# Patient Record
Sex: Male | Born: 1935 | Race: White | Hispanic: No | Marital: Married | Smoking: Former smoker
Health system: Southern US, Community
[De-identification: ages and names within clinical notes are randomized; demographics above are authoritative.]

## PROBLEM LIST (undated history)

## (undated) DIAGNOSIS — I2510 Atherosclerotic heart disease of native coronary artery without angina pectoris: Secondary | ICD-10-CM

## (undated) DIAGNOSIS — E119 Type 2 diabetes mellitus without complications: Secondary | ICD-10-CM

## (undated) DIAGNOSIS — I6529 Occlusion and stenosis of unspecified carotid artery: Secondary | ICD-10-CM

## (undated) DIAGNOSIS — C61 Malignant neoplasm of prostate: Secondary | ICD-10-CM

## (undated) DIAGNOSIS — R51 Headache: Secondary | ICD-10-CM

## (undated) DIAGNOSIS — I714 Abdominal aortic aneurysm, without rupture, unspecified: Secondary | ICD-10-CM

## (undated) DIAGNOSIS — I10 Essential (primary) hypertension: Secondary | ICD-10-CM

## (undated) DIAGNOSIS — C4490 Unspecified malignant neoplasm of skin, unspecified: Secondary | ICD-10-CM

## (undated) DIAGNOSIS — N200 Calculus of kidney: Secondary | ICD-10-CM

## (undated) DIAGNOSIS — J40 Bronchitis, not specified as acute or chronic: Secondary | ICD-10-CM

## (undated) DIAGNOSIS — M1990 Unspecified osteoarthritis, unspecified site: Secondary | ICD-10-CM

## (undated) DIAGNOSIS — N189 Chronic kidney disease, unspecified: Secondary | ICD-10-CM

## (undated) DIAGNOSIS — I208 Other forms of angina pectoris: Secondary | ICD-10-CM

## (undated) DIAGNOSIS — I2089 Other forms of angina pectoris: Secondary | ICD-10-CM

## (undated) DIAGNOSIS — E7800 Pure hypercholesterolemia, unspecified: Secondary | ICD-10-CM

## (undated) DIAGNOSIS — K5190 Ulcerative colitis, unspecified, without complications: Secondary | ICD-10-CM

## (undated) HISTORY — DX: Other forms of angina pectoris: I20.89

## (undated) HISTORY — DX: Malignant neoplasm of prostate: C61

## (undated) HISTORY — DX: Essential (primary) hypertension: I10

## (undated) HISTORY — DX: Calculus of kidney: N20.0

## (undated) HISTORY — PX: LITHOTRIPSY: SUR834

## (undated) HISTORY — PX: CORONARY ARTERY BYPASS GRAFT: SHX141

## (undated) HISTORY — DX: Other forms of angina pectoris: I20.8

## (undated) HISTORY — PX: APPENDECTOMY: SHX54

## (undated) HISTORY — DX: Occlusion and stenosis of unspecified carotid artery: I65.29

## (undated) HISTORY — PX: SKIN CANCER EXCISION: SHX779

## (undated) HISTORY — DX: Atherosclerotic heart disease of native coronary artery without angina pectoris: I25.10

## (undated) HISTORY — DX: Abdominal aortic aneurysm, without rupture: I71.4

## (undated) HISTORY — DX: Type 2 diabetes mellitus without complications: E11.9

## (undated) HISTORY — DX: Chronic kidney disease, unspecified: N18.9

## (undated) HISTORY — PX: CORONARY ANGIOPLASTY WITH STENT PLACEMENT: SHX49

## (undated) HISTORY — DX: Abdominal aortic aneurysm, without rupture, unspecified: I71.40

## (undated) HISTORY — PX: TRANSURETHRAL RESECTION OF BLADDER TUMOR: SHX2575

## (undated) HISTORY — PX: CARDIAC CATHETERIZATION: SHX172

## (undated) HISTORY — DX: Pure hypercholesterolemia, unspecified: E78.00

---

## 1979-04-09 HISTORY — PX: OTHER SURGICAL HISTORY: SHX169

## 1994-04-08 DIAGNOSIS — C61 Malignant neoplasm of prostate: Secondary | ICD-10-CM

## 1994-04-08 HISTORY — PX: RADIOACTIVE SEED IMPLANT: SHX5150

## 1994-04-08 HISTORY — PX: ABDOMINOPERINEAL PROCTOCOLECTOMY: SUR8

## 1994-04-08 HISTORY — DX: Malignant neoplasm of prostate: C61

## 1997-09-10 ENCOUNTER — Inpatient Hospital Stay (HOSPITAL_COMMUNITY): Admission: EM | Admit: 1997-09-10 | Discharge: 1997-09-11 | Payer: Self-pay | Admitting: Emergency Medicine

## 2000-06-26 ENCOUNTER — Encounter: Payer: Self-pay | Admitting: Cardiology

## 2000-06-26 ENCOUNTER — Ambulatory Visit (HOSPITAL_COMMUNITY): Admission: RE | Admit: 2000-06-26 | Discharge: 2000-06-26 | Payer: Self-pay | Admitting: Cardiology

## 2004-01-23 ENCOUNTER — Inpatient Hospital Stay (HOSPITAL_COMMUNITY): Admission: RE | Admit: 2004-01-23 | Discharge: 2004-01-24 | Payer: Self-pay | Admitting: Cardiology

## 2004-08-02 ENCOUNTER — Ambulatory Visit: Payer: Self-pay | Admitting: Cardiology

## 2004-10-29 ENCOUNTER — Ambulatory Visit: Payer: Self-pay | Admitting: Cardiology

## 2005-02-07 ENCOUNTER — Ambulatory Visit: Payer: Self-pay | Admitting: Cardiology

## 2005-09-17 ENCOUNTER — Ambulatory Visit: Payer: Self-pay | Admitting: Cardiology

## 2005-10-03 ENCOUNTER — Ambulatory Visit: Payer: Self-pay

## 2005-10-29 ENCOUNTER — Ambulatory Visit: Payer: Self-pay | Admitting: Cardiology

## 2006-01-17 ENCOUNTER — Ambulatory Visit: Payer: Self-pay | Admitting: Cardiology

## 2006-04-25 ENCOUNTER — Ambulatory Visit: Payer: Self-pay

## 2006-05-01 ENCOUNTER — Ambulatory Visit: Payer: Self-pay | Admitting: Cardiology

## 2006-06-05 ENCOUNTER — Ambulatory Visit: Payer: Self-pay | Admitting: *Deleted

## 2006-10-23 ENCOUNTER — Ambulatory Visit: Payer: Self-pay | Admitting: Cardiology

## 2006-11-24 ENCOUNTER — Ambulatory Visit: Payer: Self-pay | Admitting: Cardiology

## 2006-11-24 ENCOUNTER — Ambulatory Visit: Payer: Self-pay

## 2007-02-12 ENCOUNTER — Ambulatory Visit: Payer: Self-pay | Admitting: Cardiology

## 2007-05-14 ENCOUNTER — Ambulatory Visit: Payer: Self-pay

## 2007-06-15 ENCOUNTER — Ambulatory Visit: Payer: Self-pay | Admitting: Cardiology

## 2007-10-15 ENCOUNTER — Ambulatory Visit: Payer: Self-pay | Admitting: Cardiology

## 2008-01-11 ENCOUNTER — Ambulatory Visit: Payer: Self-pay

## 2008-01-11 ENCOUNTER — Ambulatory Visit: Payer: Self-pay | Admitting: Cardiology

## 2008-06-01 ENCOUNTER — Ambulatory Visit: Payer: Self-pay

## 2008-06-14 ENCOUNTER — Ambulatory Visit: Payer: Self-pay | Admitting: Cardiology

## 2008-07-08 DIAGNOSIS — I714 Abdominal aortic aneurysm, without rupture, unspecified: Secondary | ICD-10-CM | POA: Insufficient documentation

## 2008-07-08 DIAGNOSIS — Z8546 Personal history of malignant neoplasm of prostate: Secondary | ICD-10-CM

## 2008-07-08 DIAGNOSIS — I10 Essential (primary) hypertension: Secondary | ICD-10-CM | POA: Insufficient documentation

## 2008-07-08 DIAGNOSIS — I25810 Atherosclerosis of coronary artery bypass graft(s) without angina pectoris: Secondary | ICD-10-CM

## 2008-07-08 DIAGNOSIS — Z87442 Personal history of urinary calculi: Secondary | ICD-10-CM

## 2008-07-08 DIAGNOSIS — I6529 Occlusion and stenosis of unspecified carotid artery: Secondary | ICD-10-CM

## 2008-07-08 DIAGNOSIS — E7800 Pure hypercholesterolemia, unspecified: Secondary | ICD-10-CM

## 2008-07-08 DIAGNOSIS — N259 Disorder resulting from impaired renal tubular function, unspecified: Secondary | ICD-10-CM | POA: Insufficient documentation

## 2008-07-13 ENCOUNTER — Encounter: Payer: Self-pay | Admitting: Cardiology

## 2008-07-13 ENCOUNTER — Ambulatory Visit: Payer: Self-pay | Admitting: Cardiology

## 2008-07-14 LAB — CONVERTED CEMR LAB
BUN: 34 mg/dL — ABNORMAL HIGH (ref 6–23)
Basophils Absolute: 0.1 10*3/uL (ref 0.0–0.1)
Basophils Relative: 1.3 % (ref 0.0–3.0)
CO2: 28 meq/L (ref 19–32)
Calcium: 9 mg/dL (ref 8.4–10.5)
Chloride: 104 meq/L (ref 96–112)
Creatinine, Ser: 2.3 mg/dL — ABNORMAL HIGH (ref 0.4–1.5)
Eosinophils Absolute: 0.2 10*3/uL (ref 0.0–0.7)
Eosinophils Relative: 2.9 % (ref 0.0–5.0)
Free T4: 0.8 ng/dL (ref 0.6–1.6)
GFR calc Af Amer: 36 mL/min
GFR calc non Af Amer: 29 mL/min
Glucose, Bld: 108 mg/dL — ABNORMAL HIGH (ref 70–99)
HCT: 34 % — ABNORMAL LOW (ref 39.0–52.0)
Hemoglobin: 11.8 g/dL — ABNORMAL LOW (ref 13.0–17.0)
Lymphocytes Relative: 30.8 % (ref 12.0–46.0)
MCHC: 34.8 g/dL (ref 30.0–36.0)
MCV: 96.2 fL (ref 78.0–100.0)
Monocytes Absolute: 0.6 10*3/uL (ref 0.1–1.0)
Monocytes Relative: 7.9 % (ref 3.0–12.0)
Neutro Abs: 4.4 10*3/uL (ref 1.4–7.7)
Neutrophils Relative %: 57.1 % (ref 43.0–77.0)
Platelets: 202 10*3/uL (ref 150–400)
Potassium: 4.6 meq/L (ref 3.5–5.1)
RBC: 3.53 M/uL — ABNORMAL LOW (ref 4.22–5.81)
RDW: 12.1 % (ref 11.5–14.6)
Sodium: 138 meq/L (ref 135–145)
T3, Free: 3.5 pg/mL (ref 2.3–4.2)
TSH: 1.62 microintl units/mL (ref 0.35–5.50)
WBC: 7.6 10*3/uL (ref 4.5–10.5)

## 2008-10-05 ENCOUNTER — Encounter: Payer: Self-pay | Admitting: Cardiology

## 2008-10-07 ENCOUNTER — Encounter: Payer: Self-pay | Admitting: Cardiology

## 2008-10-13 ENCOUNTER — Ambulatory Visit: Payer: Self-pay | Admitting: Cardiology

## 2008-11-22 ENCOUNTER — Encounter: Payer: Self-pay | Admitting: Cardiology

## 2009-01-04 ENCOUNTER — Ambulatory Visit: Payer: Self-pay | Admitting: Cardiology

## 2009-01-04 ENCOUNTER — Ambulatory Visit: Payer: Self-pay

## 2009-01-09 ENCOUNTER — Telehealth: Payer: Self-pay | Admitting: Cardiology

## 2009-01-16 ENCOUNTER — Telehealth: Payer: Self-pay | Admitting: Cardiology

## 2009-04-05 ENCOUNTER — Ambulatory Visit: Payer: Self-pay | Admitting: Cardiology

## 2009-04-05 DIAGNOSIS — I059 Rheumatic mitral valve disease, unspecified: Secondary | ICD-10-CM | POA: Insufficient documentation

## 2009-04-25 ENCOUNTER — Encounter: Payer: Self-pay | Admitting: Cardiology

## 2009-04-25 ENCOUNTER — Ambulatory Visit (HOSPITAL_COMMUNITY): Admission: RE | Admit: 2009-04-25 | Discharge: 2009-04-25 | Payer: Self-pay | Admitting: Cardiology

## 2009-04-25 ENCOUNTER — Ambulatory Visit: Payer: Self-pay | Admitting: Cardiology

## 2009-04-25 ENCOUNTER — Ambulatory Visit: Payer: Self-pay

## 2009-04-26 ENCOUNTER — Encounter: Payer: Self-pay | Admitting: Cardiology

## 2009-04-28 ENCOUNTER — Telehealth: Payer: Self-pay | Admitting: Cardiology

## 2009-05-10 ENCOUNTER — Encounter: Payer: Self-pay | Admitting: Cardiology

## 2009-05-10 ENCOUNTER — Ambulatory Visit: Payer: Self-pay | Admitting: Vascular Surgery

## 2009-06-30 ENCOUNTER — Encounter: Payer: Self-pay | Admitting: Cardiology

## 2009-07-03 ENCOUNTER — Ambulatory Visit: Payer: Self-pay

## 2009-07-03 ENCOUNTER — Ambulatory Visit: Payer: Self-pay | Admitting: Cardiology

## 2009-07-04 ENCOUNTER — Ambulatory Visit: Payer: Self-pay | Admitting: Cardiology

## 2009-07-04 ENCOUNTER — Inpatient Hospital Stay (HOSPITAL_COMMUNITY): Admission: RE | Admit: 2009-07-04 | Discharge: 2009-07-09 | Payer: Self-pay | Admitting: Cardiology

## 2009-07-04 ENCOUNTER — Telehealth: Payer: Self-pay | Admitting: Cardiology

## 2009-07-07 LAB — CONVERTED CEMR LAB
BUN: 27 mg/dL — ABNORMAL HIGH (ref 6–23)
CO2: 24 meq/L (ref 19–32)
Calcium: 9.4 mg/dL (ref 8.4–10.5)
Chloride: 104 meq/L (ref 96–112)
Creatinine, Ser: 2.1 mg/dL — ABNORMAL HIGH (ref 0.4–1.5)
GFR calc non Af Amer: 32.5 mL/min (ref >60)
Glucose, Bld: 106 mg/dL — ABNORMAL HIGH (ref 70–99)
Potassium: 4.6 meq/L (ref 3.5–5.1)
Sodium: 137 meq/L (ref 135–145)

## 2009-07-10 ENCOUNTER — Encounter: Payer: Self-pay | Admitting: Cardiology

## 2009-07-20 ENCOUNTER — Encounter: Payer: Self-pay | Admitting: Cardiology

## 2009-07-21 ENCOUNTER — Ambulatory Visit: Payer: Self-pay | Admitting: Cardiology

## 2009-07-31 ENCOUNTER — Encounter: Payer: Self-pay | Admitting: Cardiology

## 2009-07-31 LAB — CONVERTED CEMR LAB
BUN: 29 mg/dL — ABNORMAL HIGH (ref 6–23)
CO2: 21 meq/L (ref 19–32)
Calcium: 9.2 mg/dL (ref 8.4–10.5)
Chloride: 100 meq/L (ref 96–112)
Creatinine, Ser: 2.24 mg/dL — ABNORMAL HIGH (ref 0.40–1.50)
Glucose, Bld: 108 mg/dL — ABNORMAL HIGH (ref 70–99)
Potassium: 5 meq/L (ref 3.5–5.3)
Sodium: 135 meq/L (ref 135–145)

## 2009-10-02 ENCOUNTER — Ambulatory Visit: Payer: Self-pay | Admitting: Cardiology

## 2009-12-14 ENCOUNTER — Ambulatory Visit: Payer: Self-pay | Admitting: Vascular Surgery

## 2010-01-22 ENCOUNTER — Ambulatory Visit: Payer: Self-pay | Admitting: Cardiology

## 2010-01-22 ENCOUNTER — Encounter: Payer: Self-pay | Admitting: Cardiology

## 2010-01-23 ENCOUNTER — Telehealth: Payer: Self-pay | Admitting: Cardiology

## 2010-01-23 LAB — CONVERTED CEMR LAB
BUN: 39 mg/dL — ABNORMAL HIGH (ref 6–23)
CO2: 25 meq/L (ref 19–32)
Calcium: 9.2 mg/dL (ref 8.4–10.5)
Chloride: 105 meq/L (ref 96–112)
Creatinine, Ser: 2.6 mg/dL — ABNORMAL HIGH (ref 0.4–1.5)
GFR calc non Af Amer: 25.25 mL/min (ref >60)
Glucose, Bld: 124 mg/dL — ABNORMAL HIGH (ref 70–99)
Potassium: 4.4 meq/L (ref 3.5–5.1)
Sodium: 138 meq/L (ref 135–145)

## 2010-01-29 ENCOUNTER — Encounter: Payer: Self-pay | Admitting: Cardiology

## 2010-05-10 NOTE — Progress Notes (Signed)
Summary: admit to [facility] today cath in am  Phone Note Call from Patient Call back at Home Phone [phone]   Caller: Patient Reason for Call: Talk to Nurse Summary of Call: per pt calling, aware that lauren is not in office today, pt was told by ts that he suppose to be admit to [facility] for cath in am. was told to call office if he hasn't heard anything from [facility] by 3 p.m. advise pt i would send an important message to the message nurse.  Initial call taken by: Lorne Skeens,  July 04, 2009 3:07 PM  Follow-up for Phone Call        per pt placement - go to admitting.  Pt aware. Follow-up by: Charolotte Capuchin, RN,  July 04, 2009 3:28 PM

## 2010-05-10 NOTE — Assessment & Plan Note (Signed)
Summary: eph   Visit Type:  Post-hospital  CC:  Dizziness couple days ago.  History of Present Illness: Sherrie Mustache, Valinda Hoar [phone] would like results of BMET.  Had 17 cc of  contrast for cath.  Findings included patent LIMA to LAD, SVG to first OM, occluded second limb, and occluded RCA graft.  The LAD has prox disease and provides septal to PDA.   Has not done much since his discharge from the hospital. We reviewed options, reviewed films, and talked management strategy today.  No major issues at present.  He does note the difference.  Medication was added.     Current Medications (verified): 1)  Omeprazole 20 Mg Tbec (Omeprazole) .... Take 1 Tablet By Mouth Once A Day 2)  Amlodipine Besylate 10 Mg Tabs (Amlodipine Besylate) .... Take One Tablet By Mouth Daily 3)  Calcitriol 0.25 Mcg Caps (Calcitriol) .... Take One Cap. M,w,f and S 4)  Pravastatin Sodium 40 Mg Tabs (Pravastatin Sodium) .... Take One Tablet By Mouth Daily At Bedtime 5)  Potassium Citrate 1080 Mg Cr-Tabs (Potassium Citrate) .... Take 1 Tablet By Mouth Two Times A Day 6)  Folic Acid 800 Mcg Tabs (Folic Acid) .... Take 1 Tablet By Mouth Once A Day 7)  Aspirin 81 Mg Tbec (Aspirin) .... Take One Tablet By Mouth Daily 8)  Carvedilol 6.25 Mg Tabs (Carvedilol) .... Take 1 1/2 Tablets Two Times A Day 9)  Sleep Aid 25 Mg Tabs (Doxylamine Succinate (Sleep)) .... At Bedtime 10)  Isosorbide Mononitrate Cr 30 Mg Xr24h-Tab (Isosorbide Mononitrate) .... Take One Tablet By Mouth Daily 11)  Desonide 0.05 % Crea (Desonide) .... As Needed 12)  Ketoconazole 2 % Sham (Ketoconazole) .... As Directed 13)  Diphenhydramine Hcl 25 Mg Caps (Diphenhydramine Hcl) .... As Needed  Allergies: 1)  ! Darvon 2)  ! Codeine  Vital Signs:  Patient profile:   77 year old male Height:      70 inches Weight:      186 pounds BMI:     26.78 Pulse rate:   72 / minute Pulse rhythm:   regular Resp:     18 per minute BP sitting:   130 / 60  (left  arm) Cuff size:   large  Vitals Entered By: Vikki Ports (July 21, 2009 2:12 PM)  Physical Exam  General:  Well developed, well nourished, in no acute distress. Head:  normocephalic and atraumatic Eyes:  PERRLA/EOM intact; conjunctiva and lids normal. Chest Wall:  MS scar Lungs:  Clear bilaterally to auscultation and percussion. Heart:  PMI non displaced.  Normal S1 and S2.  No rub noted. S4 gallop.  Minimal apical murmur.   Abdomen:  Bowel sounds positive; abdomen soft and non-tender without masses, organomegaly, or hernias noted. No hepatosplenomegaly. Extremities:  No clubbing or cyanosis.  Groin site intact   EKG  Procedure date:  07/21/2009  Findings:      NSR.  Inferior MI, old.  Nonspecific T flattening.  Cardiac Cath  Procedure date:  07/07/2009  Findings:      CONCLUSIONS: 1. Continued patency of the internal mammary to the left anterior     descending. 2. Continued patency of the large saphenous vein graft to the large     obtuse marginal branch with occlusion of the second limb to the     third obtuse marginal branch with retrograde collateralization of     that vessel. 3. Occlusion of the right coronary artery, occlusion of the saphenous  vein graft to the right coronary artery, which are old with     collateralization to the posterior descending artery territory by     retrograde collaterals. 4. Some progression of proximal left anterior descending disease     leading into the first septal perforators, which supply collaterals     to the distal right.   DISPOSITION:  I have reviewed the films carefully, Dr. Excell Seltzer and I have reviewed as well.  With regard to options at the present time, I would lean towards medical therapy, percutaneous intervention has increased risk to the elevation creatinine and reduced creatinine clearance. Moreover, it is unlikely that dilatation of the proximal LAD, which provides 1 septal perforator.  The PDA territory would  dramatically change angina.  Importantly, the new finding is occlusion of the saphenous vein graft to distal limb to this third obtuse marginal branch.  This is now collateralized, but obviously underfilled compared  Impression & Recommendations:  Problem # 1:  CAD, ARTERY BYPASS GRAFT (ICD-414.04) Patient has had two prior CABG procedures.  He is stable, but angina has been more frequent.  LIMA is intact to a small, diffuse LAD, and the OM is as well.  The RCA graft is down, and the PDA fills via a septal provided by LAD which is diseased.  It is heavily calcified.  I have reviewed with my colleaugues and with the Cr clearance as compromised as it is, the upside does not appear to justify the downside.  The angina could well be from the distal CFX.  He is agreeable to medical regimen at the present time.  I have encouraged him to give up his driving.  k His updated medication list for this problem includes:    Amlodipine Besylate 10 Mg Tabs (Amlodipine besylate) .[name]... Take one tablet by mouth daily    Aspirin 81 Mg Tbec (Aspirin) .[name]... Take one tablet by mouth daily    Carvedilol 6.25 Mg Tabs (Carvedilol) .[name]... Take 1 1/2 tablets two times a day    Isosorbide Mononitrate Cr 30 Mg Xr24h-tab (Isosorbide mononitrate) .[name]... Take one tablet by mouth daily  Orders: EKG w/ Interpretation (93000) T-Basic Metabolic Panel ([id number])  Problem # 2:  RENAL INSUFFICIENCY (ICD-588.9) With recheck BMET, now two weeks out.  He has not been great about fluids, but have encouraged him to continue to follow. Orders: EKG w/ Interpretation (93000) T-Basic Metabolic Panel ([id number])  Problem # 3:  HYPERCHOLESTEROLEMIA (ICD-272.0) Currently on lipid lowering therapy, tolerating. His updated medication list for this problem includes:    Pravastatin Sodium 40 Mg Tabs (Pravastatin sodium) .[name]... Take one tablet by mouth daily at bedtime  Problem # 4:  MITRAL VALVE PROLAPSE (ICD-424.0) January echo shows  mild/mod MR with preserved LV.  Not a candidate for repair at present given age and renal insufficiency, and would not anyway at this point with all factors into consideration.  His updated medication list for this problem includes:    Carvedilol 6.25 Mg Tabs (Carvedilol) .[name]... Take 1 1/2 tablets two times a day    Isosorbide Mononitrate Cr 30 Mg Xr24h-tab (Isosorbide mononitrate) .[name]... Take one tablet by mouth daily  Patient Instructions: 1)  Your physician recommends that you schedule a follow-up appointment in: 2 MONTHS 2)  Your physician recommends that you have lab work today: BMP 3)  Your physician recommends that you continue on your current medications as directed. Please refer to the Current Medication list given to you today. 4)  Patient encouraged to maintain  a good fluid balance.  TS  Appended Document: eph 07/21/09 OV note and BMP faxed to [phone]. Note also faxed that BMP needs to be redrawn at appt today at 10:30.  I left a voicemail for [facility] LPN also that the pt needs a BMP drawn today.

## 2010-05-10 NOTE — Letter (Signed)
Summary: Corporate treasurer  [facility]   Imported By: Marylou Mccoy 02/19/2010 16:23:10  _____________________________________________________________________  External Attachment:    Type:   Image     Comment:   External Document

## 2010-05-10 NOTE — Assessment & Plan Note (Signed)
Summary: f65m   Visit Type:  Follow-up Primary Provider:  Dr. Rolland Bimler  in [place]  CC:  SOB.  History of Present Illness: He has been having alot of trouble with his hip, feet, and knees.  He wonders if it is related to the cholesterol medication.  He has never had that before.  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  He thinks his angina has improved quite a bit, and then he has to back off.  He no longer is driving, but he is bored to death.    Current Medications (verified): 1)  Omeprazole 20 Mg Tbec (Omeprazole) .... Take 1 Tablet By Mouth Once A Day 2)  Amlodipine Besylate 10 Mg Tabs (Amlodipine Besylate) .... Take One Tablet By Mouth Daily 3)  Calcitriol 0.25 Mcg Caps (Calcitriol) .... Take One Cap. M,w,f and S 4)  Pravastatin Sodium 40 Mg Tabs (Pravastatin Sodium) .... Take One Tablet By Mouth Daily At Bedtime 5)  Potassium Citrate 1080 Mg Cr-Tabs (Potassium Citrate) .... Take 1 Tablet By Mouth Two Times A Day 6)  Folic Acid 800 Mcg Tabs (Folic Acid) .... Take 1 Tablet By Mouth Once A Day 7)  Aspirin 81 Mg Tbec (Aspirin) .... Take One Tablet By Mouth Daily 8)  Carvedilol 3.125 Mg Tabs (Carvedilol) .... Take Three Tablets By Mouth Twice A Day 9)  Sleep Aid 25 Mg Tabs (Doxylamine Succinate (Sleep)) .... At Bedtime 10)  Isosorbide Mononitrate Cr 30 Mg Xr24h-Tab (Isosorbide Mononitrate) .... Take One Tablet By Mouth Daily 11)  Desonide 0.05 % Crea (Desonide) .... As Needed 12)  Ketoconazole 2 % Sham (Ketoconazole) .... As Directed 13)  Diphenhydramine Hcl 25 Mg Caps (Diphenhydramine Hcl) .... As Needed  Allergies (verified): 1)  ! Darvon 2)  ! Codeine  Past History:  Past Medical History: Last updated: 07/08/2008 HYPERTENSION (ICD-401.9) NEPHROLITHIASIS, HX OF (ICD-V13.01) PROSTATE CANCER, HX OF (ICD-V10.46) HYPERCHOLESTEROLEMIA (ICD-272.0) CAROTID STENOSIS (ICD-433.10) ABDOMINAL AORTIC ANEURYSM (ICD-441.4) RENAL INSUFFICIENCY  (ICD-588.9) CAD, ARTERY BYPASS GRAFT (ICD-414.04) RENAL INSUFFICIENCY (ICD-588.9)  Past Surgical History: Last updated: 07/03/2009 Artery Bypass graft surgery x 3--1992 Prior surgery in 1981  Vital Signs:  Patient profile:   77 year old male Height:      70 inches Weight:      187 pounds BMI:     26.93 Pulse rate:   74 / minute BP sitting:   141 / 78  (left arm) Cuff size:   regular  Vitals Entered By: Hardin Negus, RMA (January 22, 2010 10:12 AM)  Physical Exam  General:  Well developed, well nourished, in no acute distress. Head:  normocephalic and atraumatic Eyes:  PERRLA/EOM intact; conjunctiva and lids normal. Lungs:  Clear bilaterally to auscultation and percussion. Heart:  PMI non displaced.  Normal S1 and S2.  No murmur or rub.  Abdomen:  Bowel sounds positive; abdomen soft and non-tender without masses, organomegaly, or hernias noted. No hepatosplenomegaly. Pulses:  pulses normal in all 4 extremities Extremities:  No clubbing or cyanosis. Neurologic:  Alert and oriented x 3.   [place] of Abdomen  Procedure date:  12/14/2009  Findings:      DUPLEX EXAM:         AP (cm)                   TRANSVERSE (cm)   Proximal             2.6 Cm  2.4 cm   Mid                  2.5 cm                    2.8 cm   Distal               4.2 cm                    4.2 cm   Right Iliac          1.9 cm                    1.9 cm   Left Iliac           1.9 cm                    2.1 cm      PREVIOUS:  Date:  AP:  TRANSVERSE:      IMPRESSION:   1. Aneurysmal dilatation of the distal abdominal aorta noted.   2. Significant dilatation of the bilateral proximal common iliac       arteries noted.   3. Decreased visualization of the bilateral common iliac arteries due       to overlying bowel gas patterns.   Carotid Doppler  Procedure date:  12/14/2009  Findings:       IMPRESSION:   1. No hemodynamically significant stenosis of the right internal       carotid  artery noted with mild plaque formations as described       above.   2. Doppler velocities suggest a low-end 40% to 59% stenosis of the       left proximal internal carotid artery.   3. No significant change noted when compared to the previous exam on       06/05/2006.      ___________________________________________   Janetta Hora. Fields, MD      CH/MEDQ  D:  12/14/2009  T:  12/14/2009  Job:  Impression & Recommendations:  Problem # 1:  CAD, ARTERY BYPASS GRAFT (ICD-414.04) Cardiac wise overall is stable. Angina seems a bit less.  Continue medical therapy. His updated medication list for this problem includes:    Amlodipine Besylate 10 Mg Tabs (Amlodipine besylate) .[name]... Take one tablet by mouth daily    Aspirin 81 Mg Tbec (Aspirin) .[name]... Take one tablet by mouth daily    Carvedilol 3.125 Mg Tabs (Carvedilol) .[name]... Take three tablets by mouth twice a day    Isosorbide Mononitrate Cr 30 Mg Xr24h-tab (Isosorbide mononitrate) .[name]... Take one tablet by mouth daily  Orders: EKG w/ Interpretation (93000) TLB-BMP (Basic Metabolic Panel-BMET) (80048-METABOL)  Problem # 2:  HYPERTENSION (ICD-401.9) under reasonable control. His updated medication list for this problem includes:    Amlodipine Besylate 10 Mg Tabs (Amlodipine besylate) .[name]... Take one tablet by mouth daily    Aspirin 81 Mg Tbec (Aspirin) .[name]... Take one tablet by mouth daily    Carvedilol 3.125 Mg Tabs (Carvedilol) .[name]... Take three tablets by mouth twice a day  Orders: TLB-BMP (Basic Metabolic Panel-BMET) (80048-METABOL)  Problem # 3:  HYPERCHOLESTEROLEMIA (ICD-272.0) Has a fair number of joint symptoms.  This is reasonably new.  Will stop current medication with Pravastatin, and see how he is doing in a month from now.  Patient and I discussed at length.  Will see His updated medication list for this problem includes:  Pravastatin Sodium 40 Mg Tabs (Pravastatin sodium) .[name]... Take one tablet by mouth daily at bedtime--on  hold  Problem # 4:  RENAL INSUFFICIENCY (ICD-588.9) Need to monitor renal function.  Is now scheduled [facility] at the [facility] for neprho clinic.  Problem # 5:  ABDOMINAL AORTIC ANEURYSM (ICD-441.4) Being followed by [facility].  Problem # 6:  CAROTID STENOSIS (ICD-433.10) done at [facility].   His updated medication list for this problem includes:    Aspirin 81 Mg Tbec (Aspirin) .[name]... Take one tablet by mouth daily  Patient Instructions: 1)  Your physician wants you to follow-up in: 6 MONTHS.  You will receive a reminder letter in the mail two months in advance. If you don't receive a letter, please call our office to schedule the follow-up appointment. 2)  Your physician recommends that you have lab work today: BMP 3)  Your physician has recommended you make the following change in your medication: HOLD Pravastatin for 1 MONTH, please call the office in 1 MONTH and let us know if joint pain resolves

## 2010-05-10 NOTE — Assessment & Plan Note (Signed)
Summary: ROV   Visit Type:  Follow-up Primary Provider:  Dr. Rolland Bimler  in [place]   History of Present Illness: Patient is doing better since last seen.   Has not had more lab studies done at his primary office in [facility].  Had has some broncitis, and two sets of antibiotics.  Has been traveling some.  Not been having any significant chest pain.    Current Medications (verified): 1)  Omeprazole 20 Mg Tbec (Omeprazole) .... Take 1 Tablet By Mouth Once A Day 2)  Amlodipine Besylate 10 Mg Tabs (Amlodipine Besylate) .... Take One Tablet By Mouth Daily 3)  Calcitriol 0.25 Mcg Caps (Calcitriol) .... Take One Cap. M,w,f and S 4)  Pravastatin Sodium 40 Mg Tabs (Pravastatin Sodium) .... Take One Tablet By Mouth Daily At Bedtime 5)  Potassium Citrate 1080 Mg Cr-Tabs (Potassium Citrate) .... Take 1 Tablet By Mouth Two Times A Day 6)  Folic Acid 800 Mcg Tabs (Folic Acid) .... Take 1 Tablet By Mouth Once A Day 7)  Aspirin 81 Mg Tbec (Aspirin) .... Take One Tablet By Mouth Daily 8)  Carvedilol 3.125 Mg Tabs (Carvedilol) .... Take Three Tablets By Mouth Twice A Day 9)  Sleep Aid 25 Mg Tabs (Doxylamine Succinate (Sleep)) .... At Bedtime 10)  Isosorbide Mononitrate Cr 30 Mg Xr24h-Tab (Isosorbide Mononitrate) .... Take One Tablet By Mouth Daily 11)  Desonide 0.05 % Crea (Desonide) .... As Needed 12)  Ketoconazole 2 % Sham (Ketoconazole) .... As Directed 13)  Diphenhydramine Hcl 25 Mg Caps (Diphenhydramine Hcl) .... As Needed  Allergies (verified): 1)  ! Darvon 2)  ! Codeine  Past History:  Past Medical History: Last updated: 07/08/2008 HYPERTENSION (ICD-401.9) NEPHROLITHIASIS, HX OF (ICD-V13.01) PROSTATE CANCER, HX OF (ICD-V10.46) HYPERCHOLESTEROLEMIA (ICD-272.0) CAROTID STENOSIS (ICD-433.10) ABDOMINAL AORTIC ANEURYSM (ICD-441.4) RENAL INSUFFICIENCY (ICD-588.9) CAD, ARTERY BYPASS GRAFT (ICD-414.04) RENAL INSUFFICIENCY (ICD-588.9)  Vital Signs:  Patient profile:   77 year old  male Height:      70 inches Weight:      182 pounds BMI:     26.21 Pulse rate:   66 / minute BP sitting:   126 / 71  (left arm) Cuff size:   large  Vitals Entered By: Burnett Kanaris, CNA (October 02, 2009 12:21 PM)  Physical Exam  General:  Well developed, well nourished, in no acute distress. Head:  normocephalic and atraumatic Eyes:  PERRLA/EOM intact; conjunctiva and lids normal. Lungs:  Clear bilaterally to auscultation and percussion. Heart:  PMI non displaced.  Normal S1 and S2.  No diastolic murmur.  Abdomen:  Bowel sounds positive; abdomen soft and non-tender without masses, organomegaly, or hernias noted. No hepatosplenomegaly. Rectal:  normal external exam Msk:  Back normal, normal gait. Muscle strength and tone normal. Pulses:  pulses normal in all 4 extremities Extremities:  No clubbing or cyanosis. Neurologic:  Alert and oriented x 3.   Cardiac Cath  Procedure date:  07/07/2009  Findings:      CONCLUSIONS: 1. Continued patency of the internal mammary to the left anterior     descending. 2. Continued patency of the large saphenous vein graft to the large     obtuse marginal branch with occlusion of the second limb to the     third obtuse marginal branch with retrograde collateralization of     that vessel. 3. Occlusion of the right coronary artery, occlusion of the saphenous     vein graft to the right coronary artery, which are old with  collateralization to the posterior descending artery territory by     retrograde collaterals. 4. Some progression of proximal left anterior descending disease     leading into the first septal perforators, which supply collaterals     to the distal right.   DISPOSITION:  I have reviewed the films carefully, Dr. Excell Seltzer and I have reviewed as well.  With regard to options at the present time, I would lean towards medical therapy, percutaneous intervention has increased risk to the elevation creatinine and reduced creatinine  clearance. Moreover, it is unlikely that dilatation of the proximal LAD, which provides 1 septal perforator.  The PDA territory would dramatically change angina.  Importantly, the new finding is occlusion of the saphenous vein graft to distal limb to this third obtuse marginal branch.  This is now collateralized, but obviously underfilled compared to what it was when the vessel was patent in 2005.  Based on these findings, we will add isosorbide mononitrate to his regimen, see him back in followup.  An attempt could be done to do the proximal LAD, but the likelihood that it would improve symptoms substantially are relatively low with some increased risk as noted.  Therefore, medical approach would be the initially provided.  His family has been counseled about all of these things.  EKG  Procedure date:  10/02/2009  Findings:      NSR.  Old inferior MI, age indeterminate.  Impression & Recommendations:  Problem # 1:  CAD, ARTERY BYPASS GRAFT (ICD-414.04)  symptoms are stable at present time.  His updated medication list for this problem includes:    Amlodipine Besylate 10 Mg Tabs (Amlodipine besylate) .[name]... Take one tablet by mouth daily    Aspirin 81 Mg Tbec (Aspirin) .[name]... Take one tablet by mouth daily    Carvedilol 3.125 Mg Tabs (Carvedilol) .[name]... Take three tablets by mouth twice a day    Isosorbide Mononitrate Cr 30 Mg Xr24h-tab (Isosorbide mononitrate) .[name]... Take one tablet by mouth daily  Orders: EKG w/ Interpretation (93000)  Problem # 2:  HYPERTENSION (ICD-401.9) stable.  controlled at present.  His updated medication list for this problem includes:    Amlodipine Besylate 10 Mg Tabs (Amlodipine besylate) .[name]... Take one tablet by mouth daily    Aspirin 81 Mg Tbec (Aspirin) .[name]... Take one tablet by mouth daily    Carvedilol 3.125 Mg Tabs (Carvedilol) .[name]... Take three tablets by mouth twice a day  Problem # 3:  HYPERCHOLESTEROLEMIA (ICD-272.0)  on lipid lowering  therapy. His updated medication list for this problem includes:    Pravastatin Sodium 40 Mg Tabs (Pravastatin sodium) .[name]... Take one tablet by mouth daily at bedtime  Orders: EKG w/ Interpretation (93000)  Patient Instructions: 1)  Your physician recommends that you continue on your current medications as directed. Please refer to the Current Medication list given to you today. 2)  Your physician wants you to follow-up in: 4 MONTHS.  You will receive a reminder letter in the mail two months in advance. If you don't receive a letter, please call our office to schedule the follow-up appointment.

## 2010-05-10 NOTE — Progress Notes (Signed)
Summary: Med List  Med List   Imported By: Roderic Ovens 08/14/2009 11:41:14  _____________________________________________________________________  External Attachment:    Type:   Image     Comment:   External Document

## 2010-05-10 NOTE — Progress Notes (Signed)
Summary: pt would like echo results  Phone Note Call from Patient Call back at Home Phone [phone]   Caller: Patient Reason for Call: Talk to Nurse, Lab or Test Results Summary of Call: pt would like echo results/lg Initial call taken by: Omer Jack,  April 28, 2009 11:56 AM  Follow-up for Phone Call        Spoke  with pt. regarding 2-D echo results. I let pt. know Dr. Riley Kill needs to review test results and make recomendations. MD's nurse will call you a soon as it is done. Pt. states he is anxious to know the results. Ollen Gross, RN, BSN  April 28, 2009 12:11 PM   Additional Follow-up for Phone Call Additional follow up Details #1::        results reviewed with patient.  See append to echo report.  Will see in followup in March. Additional Follow-up by: Ronaldo Miyamoto, MD, [facility],  May 05, 2009 6:35 PM

## 2010-05-10 NOTE — Assessment & Plan Note (Signed)
Summary: 75 month   Visit Type:  75 months follow up  CC:  Chest pain and left neck pain.  History of Present Illness: For the past two weeks, any time he exerts himself he gets burning in the neck down to his upper chest.  It is very noticeable.  He stops, it will ease up and go away.  It has gotten progressively worse over the past two weeks.  Last week he went to [place] and back, and can drive without diffculty.  Not had pain at rest, none.  Working in yard will bring out symptoms, and this is clearly new in the past couple of weeks.    Current Medications (verified): 1)  Omeprazole 20 Mg Tbec (Omeprazole) .... Take 1 Tablet By Mouth Once A Day 2)  Amlodipine Besylate 10 Mg Tabs (Amlodipine Besylate) .... Take One Tablet By Mouth Daily 3)  Calcitriol 0.25 Mcg Caps (Calcitriol) .... Take One Cap. M,w,f and S 4)  Pravastatin Sodium 40 Mg Tabs (Pravastatin Sodium) .... Take One Tablet By Mouth Daily At Bedtime 5)  Potassium Citrate 1080 Mg Cr-Tabs (Potassium Citrate) .... Take 1 Tablet By Mouth Two Times A Day 6)  Folic Acid 800 Mcg Tabs (Folic Acid) .... Take 1 Tablet By Mouth Once A Day 7)  Aspirin 81 Mg Tbec (Aspirin) .... Take One Tablet By Mouth Daily 8)  Carvedilol 6.25 Mg Tabs (Carvedilol) .... Take One Tablet By Mouth Two Times A Day 9)  Sleep Aid 25 Mg Tabs (Doxylamine Succinate (Sleep)) .... At Bedtime  Allergies: 1)  ! Darvon 2)  ! Codeine  Past History:  Past Medical History: Last updated: 07/08/2008 HYPERTENSION (ICD-401.9) NEPHROLITHIASIS, HX OF (ICD-V13.01) PROSTATE CANCER, HX OF (ICD-V10.46) HYPERCHOLESTEROLEMIA (ICD-272.0) CAROTID STENOSIS (ICD-433.10) ABDOMINAL AORTIC ANEURYSM (ICD-441.4) RENAL INSUFFICIENCY (ICD-588.9) CAD, ARTERY BYPASS GRAFT (ICD-414.04) RENAL INSUFFICIENCY (ICD-588.9)  Past Surgical History: Artery Bypass graft surgery x 3--1992 Prior surgery in 1981  Vital Signs:  Patient profile:   77 year old male Height:      70 inches Weight:       187.50 pounds BMI:     27.00 Pulse rate:   72 / minute Pulse rhythm:   regular Resp:     18 per minute BP sitting:   140 / 74  (left arm) Cuff size:   large  Vitals Entered By: Vikki Ports (July 03, 2009 11:08 AM)  Physical Exam  General:  Well developed, well nourished, in no acute distress. Head:  normocephalic and atraumatic Eyes:  PERRLA/EOM intact; conjunctiva and lids normal. Neck:  Bilateral carotid bruits.  Chest Wall:  no deformities or breast masses noted Lungs:  Clear bilaterally to auscultation and percussion. Heart:  PMI non displaced.  Holosystolic murmur at apex.  No rub.   Extremities:  No clubbing or cyanosis.  Pulses intact.  No edema. Neurologic:  Alert and oriented x 3.   EKG  Procedure date:  07/03/2009  Findings:      Normal Sinus rhythm.  Nonspecific T abnormality.    Echocardiogram  Procedure date:  04/25/2009  Findings:       Study Conclusions    - Left ventricle: The cavity size was normal. There was mild     concentric hypertrophy. Systolic function was normal. The     estimated ejection fraction was in the range of 55% to 60%. Wall     motion was normal; there were no regional wall motion     abnormalities. Features are consistent with a pseudonormal  left     ventricular filling pattern, with concomitant abnormal relaxation     and increased filling pressure (grade 2 diastolic dysfunction).   - Mitral valve: Mild to moderate regurgitation.  Impression & Recommendations:  Problem # 1:  CAD, ARTERY BYPASS GRAFT (ICD-414.04)  Progressive symptoms.  Sounds ischemic in nature.  Would favor repeat cath procedure, despite Cr.  reviewed in detail with patient and wife. His updated medication list for this problem includes:    Amlodipine Besylate 10 Mg Tabs (Amlodipine besylate) .[name]... Take one tablet by mouth daily    Aspirin 81 Mg Tbec (Aspirin) .[name]... Take one tablet by mouth daily    Carvedilol 6.25 Mg Tabs (Carvedilol) .[name]... Take one  tablet by mouth two times a day  Orders: EKG w/ Interpretation (93000) TLB-BMP (Basic Metabolic Panel-BMET) (80048-METABOL)  Problem # 2:  MITRAL VALVE PROLAPSE (ICD-424.0) Prominent murmur.   His updated medication list for this problem includes:    Carvedilol 6.25 Mg Tabs (Carvedilol) .[name]... Take one tablet by mouth two times a day  Problem # 3:  CAROTID STENOSIS (ICD-433.10) new dopplers done.   His updated medication list for this problem includes:    Aspirin 81 Mg Tbec (Aspirin) .[name]... Take one tablet by mouth daily  Problem # 4:  RENAL INSUFFICIENCY (ICD-588.9)  Last Cr 2.1.  Will need hydration before. cath.   Orders: EKG w/ Interpretation (93000) TLB-BMP (Basic Metabolic Panel-BMET) (80048-METABOL)  Patient Instructions: 1)  Your physician recommends that you have lab work today: BMP 2)  Will await lab results before making further plans.

## 2010-05-10 NOTE — Progress Notes (Signed)
Summary: Lab Results--refer to Nephrology  Phone Note Call from Patient   Caller: Patient Summary of Call: I spoke with the pt and made him aware of lab results.  The pt would like an order mailed to him for BMP in 1 week.  The pt said he had seen a Nephrologist with [facility] in the past.  The pt thinks it was Dr Hyman Hopes.  I will try to arrange an appt with Nephrology for renal insufficiency. Order placed for Nephrology appt.   I also faxed the pt's OV note and BMP to PCP at [phone] (phone [phone]). Initial call taken by: Julieta Gutting, RN, BSN,  January 23, 2010 2:08 PM     Appended Document: Lab Results--refer to Nephrology The pt called because he contacted the [facility] and was able to get an appt with his Nephrologist on 01/29/10 at 2:00. I will cancel referral to [facility].  Copy of lab mailed to the pt.

## 2010-05-10 NOTE — Letter (Signed)
Summary: [facility]   [facility]   Imported By: Roderic Ovens 05/31/2009 09:34:04  _____________________________________________________________________  External Attachment:    Type:   Image     Comment:   External Document

## 2010-05-10 NOTE — Miscellaneous (Signed)
Summary: Orders Update  Clinical Lists Changes  Orders: Added new Test order of Carotid Duplex (Carotid Duplex) - Signed 

## 2010-05-30 NOTE — Letter (Signed)
Summary: [facility] Nephrology Follow Up Note   [facility] Nephrology Follow Up Note   Imported By: Roderic Ovens 05/23/2010 14:29:07  _____________________________________________________________________  External Attachment:    Type:   Image     Comment:   External Document

## 2010-06-05 NOTE — Letter (Signed)
Summary: Nephrology - Follow Up  Nephrology - Follow Up   Imported By: Marylou Mccoy 05/16/2010 09:54:51  _____________________________________________________________________  External Attachment:    Type:   Image     Comment:   External Document

## 2010-06-27 LAB — BASIC METABOLIC PANEL
CO2: 22 mEq/L (ref 19–32)
Calcium: 8.6 mg/dL (ref 8.4–10.5)
Chloride: 104 mEq/L (ref 96–112)
Chloride: 106 mEq/L (ref 96–112)
Chloride: 107 mEq/L (ref 96–112)
Creatinine, Ser: 1.83 mg/dL — ABNORMAL HIGH (ref 0.4–1.5)
Creatinine, Ser: 2.02 mg/dL — ABNORMAL HIGH (ref 0.4–1.5)
GFR calc Af Amer: 36 mL/min — ABNORMAL LOW (ref >60)
GFR calc Af Amer: 39 mL/min — ABNORMAL LOW (ref >60)
GFR calc Af Amer: 43 mL/min — ABNORMAL LOW (ref >60)
Potassium: 3.9 mEq/L (ref 3.5–5.1)
Sodium: 134 mEq/L — ABNORMAL LOW (ref 135–145)

## 2010-06-27 LAB — CBC
RBC: 2.92 MIL/uL — ABNORMAL LOW (ref 4.22–5.81)
WBC: 6.8 10*3/uL (ref 4.0–10.5)

## 2010-06-27 LAB — GLUCOSE, CAPILLARY: Glucose-Capillary: 116 mg/dL — ABNORMAL HIGH (ref 70–99)

## 2010-07-01 LAB — CBC
HCT: 34.1 % — ABNORMAL LOW (ref 39.0–52.0)
Hemoglobin: 12 g/dL — ABNORMAL LOW (ref 13.0–17.0)
MCHC: 35.2 g/dL (ref 30.0–36.0)
RDW: 12.7 % (ref 11.5–15.5)

## 2010-07-01 LAB — GLUCOSE, CAPILLARY

## 2010-07-01 LAB — BASIC METABOLIC PANEL
BUN: 25 mg/dL — ABNORMAL HIGH (ref 6–23)
CO2: 24 mEq/L (ref 19–32)
CO2: 24 mEq/L (ref 19–32)
Chloride: 103 mEq/L (ref 96–112)
Chloride: 105 mEq/L (ref 96–112)
Creatinine, Ser: 1.86 mg/dL — ABNORMAL HIGH (ref 0.4–1.5)
GFR calc Af Amer: 43 mL/min — ABNORMAL LOW (ref >60)
Glucose, Bld: 124 mg/dL — ABNORMAL HIGH (ref 70–99)
Potassium: 3.8 mEq/L (ref 3.5–5.1)
Potassium: 3.9 mEq/L (ref 3.5–5.1)
Potassium: 4.2 mEq/L (ref 3.5–5.1)
Sodium: 137 mEq/L (ref 135–145)

## 2010-07-01 LAB — APTT: aPTT: 33 seconds (ref 24–37)

## 2010-07-12 ENCOUNTER — Encounter: Payer: Self-pay | Admitting: Cardiology

## 2010-08-07 ENCOUNTER — Encounter: Payer: Self-pay | Admitting: Cardiology

## 2010-08-07 ENCOUNTER — Other Ambulatory Visit: Payer: Self-pay | Admitting: Cardiology

## 2010-08-07 DIAGNOSIS — I6529 Occlusion and stenosis of unspecified carotid artery: Secondary | ICD-10-CM

## 2010-08-08 ENCOUNTER — Ambulatory Visit (INDEPENDENT_AMBULATORY_CARE_PROVIDER_SITE_OTHER): Payer: Medicare Other | Admitting: Cardiology

## 2010-08-08 ENCOUNTER — Encounter: Payer: Self-pay | Admitting: Cardiology

## 2010-08-08 ENCOUNTER — Encounter (INDEPENDENT_AMBULATORY_CARE_PROVIDER_SITE_OTHER): Payer: Medicare Other | Admitting: Cardiology

## 2010-08-08 VITALS — BP 140/60 | HR 77 | Resp 14 | Ht 67.0 in | Wt 187.0 lb

## 2010-08-08 DIAGNOSIS — I2510 Atherosclerotic heart disease of native coronary artery without angina pectoris: Secondary | ICD-10-CM

## 2010-08-08 DIAGNOSIS — I714 Abdominal aortic aneurysm, without rupture, unspecified: Secondary | ICD-10-CM

## 2010-08-08 DIAGNOSIS — N259 Disorder resulting from impaired renal tubular function, unspecified: Secondary | ICD-10-CM

## 2010-08-08 DIAGNOSIS — I059 Rheumatic mitral valve disease, unspecified: Secondary | ICD-10-CM

## 2010-08-08 DIAGNOSIS — I6529 Occlusion and stenosis of unspecified carotid artery: Secondary | ICD-10-CM

## 2010-08-08 DIAGNOSIS — I10 Essential (primary) hypertension: Secondary | ICD-10-CM

## 2010-08-08 DIAGNOSIS — I25810 Atherosclerosis of coronary artery bypass graft(s) without angina pectoris: Secondary | ICD-10-CM

## 2010-08-08 DIAGNOSIS — E7800 Pure hypercholesterolemia, unspecified: Secondary | ICD-10-CM

## 2010-08-08 NOTE — Assessment & Plan Note (Signed)
Symptoms remain stable at the present time. Last cath information. July 08, 2009  CONCLUSIONS:   1. Continued patency of the internal mammary to the left anterior       descending.   2. Continued patency of the large saphenous vein graft to the large       obtuse marginal branch with occlusion of the second limb to the       third obtuse marginal branch with retrograde collateralization of       that vessel.   3. Occlusion of the right coronary artery, occlusion of the saphenous       vein graft to the right coronary artery, which are old with       collateralization to the posterior descending artery territory by       retrograde collaterals.   4. Some progression of proximal left anterior descending disease       leading into the first septal perforators, which supply collaterals       to the distal right.      DISPOSITION:  I have reviewed the films carefully, Dr. Excell Seltzer and I have   reviewed as well.  With regard to options at the present time, I would   lean towards medical therapy, percutaneous intervention has increased   risk to the elevation creatinine and reduced creatinine clearance.   Moreover, it is unlikely that dilatation of the proximal LAD, which   provides 1 septal perforator.  The PDA territory would dramatically   change angina.  Importantly, the new finding is occlusion of the   saphenous vein graft to distal limb to this third obtuse marginal   branch.  This is now collateralized, but obviously underfilled compared   to what it was when the vessel was patent in 2005.  Based on these   findings, we will add isosorbide mononitrate to his regimen, see him   back in followup.  An attempt could be done to do the proximal LAD, but   the likelihood that it would improve symptoms substantially are   relatively low with some increased risk as noted.  Therefore, medical   approach would be the initially provided.  His family has been counseled   about all of these things.      Based on symptoms, will continue to monitor.

## 2010-08-08 NOTE — Assessment & Plan Note (Signed)
Followed by neprhology.  Note sent for scanning.

## 2010-08-08 NOTE — Assessment & Plan Note (Signed)
Patient has labs done at primary care in [facility].

## 2010-08-08 NOTE — Assessment & Plan Note (Signed)
Carotid study currently pending.

## 2010-08-08 NOTE — Assessment & Plan Note (Signed)
Soft murmur on  Exam. Last echo Apr 25, 2009  Study Conclusions    - Left ventricle: The cavity size was normal. There was mild     concentric hypertrophy. Systolic function was normal. The     estimated ejection fraction was in the range of 55% to 60%. Wall     motion was normal; there were no regional wall motion     abnormalities. Features are consistent with a pseudonormal left     ventricular filling pattern, with concomitant abnormal relaxation     and increased filling pressure (grade 2 diastolic dysfunction).   - Mitral valve: Mild to moderate regurgitation. Given findings on exam, will continue to monitor.  Repeat echo in 6-12 months

## 2010-08-08 NOTE — Progress Notes (Signed)
HPI:  Mitchell Sloan is in for follow up to day.  In general, he is doing ok.  He does not have as much steam as he has had in the past.  However, he is ok in general.  He has seen nephrology in [place] and they have limited the protein in his diet.  He had AAA surveillance at [facility] in the fall and those results.  He had a carotid study done today, although results are not yet available.  He gets mild shortness of breath with some activity, but overall seems to get along fairly well.  No chest pain.    Current Outpatient Prescriptions  Medication Sig Dispense Refill  . amLODipine (NORVASC) 10 MG tablet Take 10 mg by mouth daily.        [name] aspirin 81 MG tablet Take 81 mg by mouth daily.        . calcitRIOL (ROCALTROL) 0.25 MCG capsule Take 0.25 mcg by mouth. Take 1 capsule M-W-F and Saturdays       . carvedilol (COREG) 3.125 MG tablet Take 3.125 mg by mouth. Take 3 tablets twice a day      . desonide (DESOWEN) 0.05 % cream Apply topically as needed.        . Doxylamine Succinate, Sleep, (SLEEP AID) 25 MG tablet Take 25 mg by mouth at bedtime as needed.        . Dutasteride-Tamsulosin HCl 0.5-0.4 MG CAPS Take 1 tablet by mouth daily.        . folic acid (FOLVITE) 800 MCG tablet Take 800 mcg by mouth daily.        . isosorbide mononitrate (IMDUR) 30 MG 24 hr tablet Take 30 mg by mouth daily.        [name] omeprazole (PRILOSEC) 20 MG capsule Take 20 mg by mouth daily.        . potassium citrate (UROCIT-K) 10 MEQ (1080 MG) SR tablet Take 10 mEq by mouth 2 (two) times daily.        . pravastatin (PRAVACHOL) 40 MG tablet Take 40 mg by mouth daily. ON HOLD       . solifenacin (VESICARE) 5 MG tablet Take 5 mg by mouth daily.        [name] DISCONTD: diphenhydrAMINE (BENADRYL) 25 mg capsule Take 25 mg by mouth every 6 (six) hours as needed.        [name] DISCONTD: ketoconazole (NIZORAL) 2 % cream Apply topically as directed.          Allergies  Allergen Reactions  . Codeine   . Oxycodone   . Propoxyphene Hcl     Past  Medical History  Diagnosis Date  . Hypertension   . Nephrolithiasis     hx of  . Prostate cancer   . Hypercholesterolemia   . Carotid stenosis   . AAA (abdominal aortic aneurysm)   . Renal insufficiency   . Coronary artery disease     Past Surgical History  Procedure Date  . Coronary artery bypass graft 1992    x 3  . Prior surgery 1981    Family History  Problem Relation Age of Onset  . Heart attack Father 77    died    History   Social History  . Marital Status: Married    Spouse Name: N/A    Number of Children: 3  . Years of Education: N/A   Occupational History  . Not on file.   Social History Main Topics  . Smoking  status: Never Smoker   . Smokeless tobacco: Not on file  . Alcohol Use: No  . Drug Use: No  . Sexually Active: Not on file   Other Topics Concern  . Not on file   Social History Narrative  . No narrative on file    ROS: Please see the HPI.  All other systems reviewed and negative.  PHYSICAL EXAM:  There were no vitals taken for this visit.  General: Well developed, well nourished, in no acute distress. Head:  Normocephalic and atraumatic. Neck: no JVD Lungs: Clear to auscultation and percussion. Heart: Normal S1 and S2.  Apical murmur. Abdomen:  Normal bowel sounds; soft; non tender; no organomegaly.  No obvious mass on exam Pulses: Pulses normal in all 4 extremities. Extremities: No clubbing or cyanosis. No edema. Neurologic: Alert and oriented x 3.  EKG:  NSR.  Inferior MI, old.   ASSESSMENT AND PLAN:op

## 2010-08-08 NOTE — Assessment & Plan Note (Signed)
Reviewed.  He remembers he is to see [facility] but need to make an appointment.  Last scan was done in September 2011---4.2 by 4.2

## 2010-08-08 NOTE — Assessment & Plan Note (Signed)
BP appears stable.

## 2010-08-08 NOTE — Patient Instructions (Addendum)
Please follow up with the [facility] to see Dr. Darrick Penna @ [phone].  Your physician recommends that you schedule a follow-up appointment in: 6 months with Dr. Riley Kill

## 2010-08-14 ENCOUNTER — Encounter: Payer: Self-pay | Admitting: Cardiology

## 2010-08-16 ENCOUNTER — Telehealth: Payer: Self-pay | Admitting: Cardiology

## 2010-08-16 NOTE — Telephone Encounter (Signed)
Pt returning Lauren call. Pt wants to speak with lauren.

## 2010-08-16 NOTE — Telephone Encounter (Signed)
Pt aware of carotid results.  

## 2010-08-21 NOTE — Assessment & Plan Note (Signed)
[facility]                            CARDIOLOGY OFFICE NOTE   Lamonte, Hartt JAYME CHAM                      MRN:          [id number]  DATE:10/23/2006                            DOB:          04-24-1927    Mr. Smail is in for a check. He is scheduled to be seen in about two  weeks. He had a severe episode of chest pain last week. It was 95  degrees in the middle of the day and he was up on top of his roof and he  was swatting at a wasps nest. He then developed a sharp pain, but he  said that it only lasted for seconds to a minute. He said that it is the  worst that he has never had. He did get somewhat clammy, but it all went  away and has never recurred. His wife wanted to carry him to the  emergency room, but he refused at that point. He has subsequently gone  on to [place] and driven a bus back from [place]. He has had a little bit of  left arm tingling, but no acute changes, and he really has been feeling  quite well since that time.   MEDICATIONS:  1. Fluvastatin 20 mg daily.  2. Folic acid 1 mg daily.  3. Aspirin 325 mg 1/2 daily.  4. [employer] brand Tylenol p.r.n.  5. Multivitamin p.r.n.  6. Amlodipine 10 mg daily.  7. Metoprolol 25 mg b.i.d.   PHYSICAL EXAMINATION:  GENERAL:  He is alert and oriented.  VITAL SIGNS:  Weight 182 pounds. There are no neurologic abnormalities.  Blood pressure 122/60, pulse 68. The pulses are equal bilaterally.  LUNGS:  Fields are clear to auscultation and percussion. The  mediastinotomy is healed.  CARDIAC:  Rhythm is regular without a significant murmur noted.  EXTREMITIES:  Without edema.   1. The chest x-ray reveals normal heart size and no definite aortic      enlargement.  2. The electrocardiogram demonstrates normal sinus rhythm and is      within normal limits.   IMPRESSION:  1. Coronary artery disease status post coronary artery bypass graft      surgery x2.  2. Recent episode of chest pain, probably  musculoskeletal, patient      concerned about myocardial infarction.  3. Chronic kidney disease.  4. Hypertension.  5. Mild glucose intolerance.   RECOMMENDATIONS:  The patient is concerned about his symptoms. We  reviewed his chest x-ray and his EKG today. His exam is non-revealing. I  doubt that this was a cardiac given by its brevity. However, I have  cautioned him about the environment that he was in, including working in  a hot midday [place] sun. We have told him to take it easy over the next  few weeks. We will see  him back in follow up in about two weeks and I am going to get a  troponin to be absolutely sure. Should he have any problems in the  interim, he is contact us directly.     Arturo Morton. Riley Kill, MD, [facility]  Electronically Signed    TDS/MedQ  DD: 10/23/2006  DT: 10/24/2006  Job #: [id number]

## 2010-08-21 NOTE — Assessment & Plan Note (Signed)
[facility]                            CARDIOLOGY OFFICE NOTE   Mitchell, Ancrum DAE Sloan                      MRN:          [id number]  DATE:06/15/2007                            DOB:          Dec 20, 1927    Mr. Mitchell Sloan is in for a follow-up visit.  To briefly summarize, he has  been stable.  He has not been having any ongoing chest pain or shortness  of breath.  He did have peripheral vascular studies done.  He has had an  increase in dimension of his infrarenal fusiform aneurysm which is now  3.9 x 4.2.  He has also had some increase in his right common iliac  artery to 2.1 x 1.8 and a stable left common iliac artery of 1.8 x 1.9.  He has also had bilateral carotid disease, there has been some  progression of disease since the 2002 examination.  He has his lipids  done at the [facility], and he has been on Pravachol to try to prevent  this.  All of his labs are done at the [place].  His primary care doctor in  [place] also has given him some Cipro for what sounds like a urinary  tract infection.   His medications currently include omeprazole 20 mg daily, amlodipine 10  mg daily, calcitriol 0.25 mcg daily, pravastatin 40 mg nightly,  metoprolol tartrate 50 mg b.i.d. potassium citrate daily, folic acid 80  mcg daily, multivitamin 1 daily, aspirin 162 mg daily, Cipro 500 b.i.d.   PHYSICAL EXAMINATION:  He is alert and oriented in no distress.  Blood pressure is 130/62, pulse is 59.  The lung fields are clear.  The cardiac rhythm is regular.   Electrocardiogram demonstrates normal sinus rhythm, essentially within  normal limits.   Review of his abdominal aorta and also his carotid Doppler studies were  done in the office today.   IMPRESSION:  1. Coronary disease status post coronary bypass graft surgery with      history of ejection fraction of 61% in 2007.  2. Bilateral carotid artery stenoses of moderate degree.  3. Abdominal aortic aneurysm, just  over 4 cm with bilateral iliac      aneurysms.  4. Hypertension.  5. History of prostate cancer.  6. History of ulcerative colitis status post proctocolectomy with      ureteral initiation.   RECOMMENDATIONS:  1. Continue current medical regimen.  2. Return to clinic in 6 months.  3. Call for any change in status.     Arturo Morton. Riley Kill, MD, [facility]  Electronically Signed    TDS/MedQ  DD: 06/15/2007  DT: 06/16/2007  Job #: [id number]

## 2010-08-21 NOTE — Assessment & Plan Note (Signed)
[facility]                            CARDIOLOGY OFFICE NOTE   Mitchell Sloan                      MRN:          [id number]  DATE:11/24/2006                            DOB:          [birth year]    Mr. Mitchell Sloan is in for a followup visit.  In general, he has been stable.  He has not been having any ongoing chest pain or shortness of breath.  Since his last visit a month ago, he has had absolutely no symptoms  whatsoever.  He is scheduled to be seen in followup with [facility], although  he wants to cancel it and put this on for a later date.   CURRENT MEDICATIONS:  1. Pravastatin 40 mg daily.  2. Folic acid 1 mg daily.  3. Aspirin 325 mg daily.  4. Omeprazole 20 mg daily.  5. Amlodipine 10 mg daily.  6. Metoprolol 25 b.i.d.  7. Acetaminophen b.i.d.   PHYSICAL EXAMINATION:  He is alert and oriented.  Blood pressure is 150/63.  The pulse is 66.  The lung fields are clear.  The cardiac rhythm is regular.  EXTREMITIES:  No edema.   IMPRESSION:  1. Coronary artery disease status post coronary artery bypass graft      surgery.  2. Chronic kidney disease.  3. Abdominal aortic aneurysm.  4. Bilateral carotid disease.   PLAN:  1. Continue followup in cardiology.  2. The patient is being seen in nephrology at the [facility].  All      of his studies have been done down there.  3. Mild bilateral renal artery stenosis 159% as of June 2007.   PLAN:  He will return to clinic in 3 months.  At that time, we might  want to consider whether or not he needs a repeat renal artery  ultrasound, but he is being managed primarily at the [facility].     Maisie Fus D. Riley Kill, MD, [facility]  Electronically Signed   TDS/MedQ  DD: 11/24/2006  DT: 11/24/2006  Job #: [id number]

## 2010-08-21 NOTE — Assessment & Plan Note (Signed)
[facility]                            CARDIOLOGY OFFICE NOTE   Gaelen, Brager CALLAWAY HAILES                      MRN:          [id number]  DATE:06/14/2008                            DOB:          10/19/1927    Mr. Gurski is in for a followup visit.  The patient continues to have  some modest amount of fatigue and says he is not quite up to what he was  doing previously.  We had him undergo Myoview imaging study in the fall.  There was no significant ST-segment change and there was some moderate  ischemia in the anterior wall.  We have talked about the possibility of  considering a cardiac catheterization, particularly in light of his  fatigue.  Nonetheless, we have held off on this in large because of his  renal insufficiency.  He has not had any prolonged episodes of chest  pain.   MEDICATIONS:  1. Omeprazole 20 mg daily.  2. Amlodipine 10 mg daily.  3. Calcitriol 0.25 mcg daily.  4. Pravastatin 40 mg nightly.  5. Metoprolol 50 mg b.i.d.  6. Potassium citrate b.i.d.  7. Folic acid 800 mcg daily.  8. Multivitamin daily.  9. Aspirin 162 one-half daily and equate.   PHYSICAL EXAMINATION:  VITAL SIGNS:  Blood pressure is 140/70, the pulse  is 70.  LUNGS:  Fields are clear.  CARDIAC:  Rhythm is regular.  There is a soft S4 gallop.   The echocardiogram demonstrates sinus arrhythmia, otherwise  unremarkable.  There are no EKG changes suggestive of ischemia.   IMPRESSION:  1. Known coronary artery disease with prior coronary artery bypass      graft surgery.  Recent fatigue.  2. Prior stenting of the saphenous vein grafts to the obtuse marginal      system.  3. Chronic renal insufficiency with moderately elevated creatinines      followed at the [place].   PLAN:  1. Check basic metabolic profile.  2. Check hemoglobin.  3. Return to clinic in a few weeks to review.    Arturo Morton. Riley Kill, MD, [facility]  Electronically Signed   TDS/MedQ  DD: 06/19/2008  DT:  06/20/2008  Job #: [id number]

## 2010-08-21 NOTE — Procedures (Signed)
CAROTID DUPLEX EXAM   INDICATION:  Carotid disease.   HISTORY:  Diabetes:  Yes.  Cardiac:  MI.  Hypertension:  Yes.  Smoking:  Previous.  Previous Surgery:  No.  CV History:  Currently asymptomatic.  Amaurosis Fugax No, Paresthesias No, Hemiparesis No                                       RIGHT             LEFT  Brachial systolic pressure:         136               138  Brachial Doppler waveforms:         Normal            Normal  Vertebral direction of flow:        Antegrade         Antegrade  DUPLEX VELOCITIES (cm/sec)  CCA peak systolic                   71                76  ECA peak systolic                   91                160  ICA peak systolic                   95                134  ICA end diastolic                   22                28  PLAQUE MORPHOLOGY:                  Mixed             Mixed  PLAQUE AMOUNT:                      Mild              Mild  PLAQUE LOCATION:                    ICA               ICA/ECA/CCA   IMPRESSION:  1. No hemodynamically significant stenosis of the right internal      carotid artery noted with mild plaque formations as described      above.  2. Doppler velocities suggest a low-end 40% to 59% stenosis of the      left proximal internal carotid artery.  3. No significant change noted when compared to the previous exam on      06/05/2006.   ___________________________________________  Janetta Hora. Fields, MD   CH/MEDQ  D:  12/14/2009  T:  12/14/2009  Job:  [id number]

## 2010-08-21 NOTE — Assessment & Plan Note (Signed)
[facility]                            CARDIOLOGY OFFICE NOTE   Mitchell Sloan, Mitchell Sloan Mitchell Sloan                      MRN:          [id number]  DATE:10/15/2007                            DOB:          13-Sep-1927    Mitchell Sloan is in for a followup visit.  In general, he has been stable.  He does feel a little bit of fatigue.  He denies any chest pain.  He  does tell me that his nephrologist quit and he is not scheduled to be  seen until December.  When I had seen him back in March, he developed  urinary tract infection and was treated with Cipro.   CURRENT MEDICATIONS:  1. Omeprazole 20 mg daily.  2. Amlodipine 10 mg daily.  3. Calcitriol 0.25 mg daily.  4. Pravastatin 40 mg daily.  5. Metoprolol tartrate 50 mg 2 times daily.  6. Potassium 2 times daily.  7. Folic acid daily.  8. Multivitamin daily.  9. Aspirin 162 mg daily.   PHYSICAL EXAMINATION:  VITAL SIGNS:  The blood pressure is 144/70, the  pulse is 59.  LUNGS:  Fields are clear.  CARDIAC:  Rhythm is regular.   His electrocardiogram demonstrates sinus bradycardia and is otherwise  unremarkable.   The patient's aneurysm measured in February of this year was 3.9 x 4.2  cm.   Overall, the patient has continued to remain stable.  We have not been  doing his laboratory studies here as they have been done at the [place].  His  last studies here revealed a creatinine of 1.8.  I do think these need  to be followed fairly closely.  One wonders whether he should consider  getting some private nephrology care.  The patient will see [place] back in  Cardiology in 6 months' time.     Mitchell Sloan. Riley Kill, MD, [facility]  Electronically Signed    TDS/MedQ  DD: 11/21/2007  DT: 11/22/2007  Job #: [id number]

## 2010-08-21 NOTE — Procedures (Signed)
DUPLEX ULTRASOUND OF ABDOMINAL AORTA   INDICATION:  Abdominal aortic and common iliac artery aneurysms   HISTORY:  Diabetes:  yes  Cardiac:  MI  Hypertension:  yes  Smoking:  Previous  Connective Tissue Disorder:  Family History:  no  Previous Surgery:  History of ileostomy   DUPLEX EXAM:         AP (cm)                   TRANSVERSE (cm)  Proximal             2.6 Cm                    2.4 cm  Mid                  2.5 cm                    2.8 cm  Distal               4.2 cm                    4.2 cm  Right Iliac          1.9 cm                    1.9 cm  Left Iliac           1.9 cm                    2.1 cm   PREVIOUS:  Date:  AP:  TRANSVERSE:   IMPRESSION:  1. Aneurysmal dilatation of the distal abdominal aorta noted.  2. Significant dilatation of the bilateral proximal common iliac      arteries noted.  3. Decreased visualization of the bilateral common iliac arteries due      to overlying bowel gas patterns.   ___________________________________________  Janetta Hora Fields, MD   CH/MEDQ  D:  12/14/2009  T:  12/14/2009  Job:  [id number]

## 2010-08-21 NOTE — Assessment & Plan Note (Signed)
OFFICE VISIT   Mitchell Sloan, Mitchell Sloan  DOB:  06/01/1927                                       05/10/2009  ZOXWR#:[id number]   The patient is a 77 year old male referred by Dr. Riley Kill for abdominal  aortic aneurysm.  He was previously seen by my partner Dr. Madilyn Fireman in  February 2008 at which time he had a 4.1-cm abdominal aortic aneurysm.  He denies any abdominal or back pain.  He presents today basically for  further followup and continued surveillance of his aneurysm.   CHRONIC MEDICAL PROBLEMS:  Include hypertension, coronary artery  disease, elevated cholesterol, diabetes and renal dysfunction.  These  are all currently stable.   PAST SURGICAL HISTORY:  He has had coronary artery bypass grafting in  1981 and 1982.  He has also previously had an ileostomy for ulcerative  colitis.  He had tonsillectomy in the past.   FAMILY HISTORY:  Unremarkable.   SOCIAL HISTORY:  He is married, has 3 children.  He is retired.  He is a  former smoker but quit in 1975.  He does not consume alcohol regularly.   REVIEW OF SYSTEMS:  Performed in 12 points.  Please see intake referral  form for details regarding this.   PHYSICAL EXAMINATION:  Blood pressure 129/74 in the left arm, heart rate  81 and regular, respirations 20.  HEENT:  Unremarkable.  Neck:  Has 2+  carotid pulses without bruit.  Chest:  Clear to auscultation.  Cardiac:  Regular rate rhythm without murmur.  Abdomen:  Soft, nontender,  nondistended.  Vaguely palpable pulsatile epigastric mass.  He has a  right lower quadrant ileostomy.  He has 2+ radial, 2+ femoral, 2+  dorsalis pedis pulses bilaterally.  He has scars consistent with  bilateral saphenectomy.  Skin:  Has no ulcers or rashes.  Neurologic:  Symmetric upper extremity and  lower extremity motor strength which is  5/5 and symmetric.  Musculoskeletal:  No obvious major deformities.   I reviewed his ultrasound from [facility] dated January 04, 2009.  This showed a 4.2 x 4.2 cm abdominal aortic aneurysm.  He also  had common iliac artery aneurysms bilaterally which were 2.2 cm on the  right, 2.3 cm on the left.   In summary, the patient has a small abdominal aortic aneurysm as well as  small bilateral common iliac aneurysms.  I discussed with him that  if the aneurysm diameter reached greater than 3 cm in the iliac or  greater than 5.5 cm in the abdominal aorta of became symptomatic, we  would consider repair. Otherwise we will continue his surveillance every  6 months.  It has also been several years since he had a carotid duplex  exam and had moderate stenosis in the past.  We will repeat this when he  returns in 6 months for his abdominal aortic aneurysm ultrasound.     Janetta Hora. Fields, MD  Electronically Signed   CEF/MEDQ  D:  05/10/2009  T:  05/11/2009  Job:  [id number]   cc:   Arturo Morton. Riley Kill, MD, [facility]

## 2010-08-21 NOTE — Assessment & Plan Note (Signed)
[facility]                            CARDIOLOGY OFFICE NOTE   Yoni, Lobos PAT SIRES                      MRN:          [id number]  DATE:02/12/2007                            DOB:          03/24/1928    Mr. Talarico is in for followup.  He really is feeling quite well.  He  denies any chest pain or significant shortness of breath.  He does need  followup of his abdominal aortic aneurysm.  He is also having his labs  checked and his general medical care at the [facility].  He denies any  symptoms whatsoever.  He has not had syncope or presyncope.   PHYSICAL EXAMINATION:  GENERAL:  He is an alert and oriented gentleman  in no acute distress.  VITAL SIGNS:  The blood pressure is 138/70, pulse 65.  LUNGS:  The lung fields are clear.  CARDIAC:  Rhythm is regular.  EXTREMITIES:  Reveal no edema.   EKG reveals a sinus rhythm with marked sinus arrhythmia and nonspecific  T wave abnormality.   IMPRESSION:  1. Coronary artery disease, status post coronary artery bypass graft      surgery.  2. Chronic kidney disease, followed at the [place].  3. Hypercholesterolemia, followed at the [place].  4. Abdominal aortic aneurysm.  5. History of carotid bruits.   PLAN:  1. Return to clinic in February.  2. At that time, we will get an abdominal ultrasound as well as      carotid dopplers.  This will be assessed at that time.  3. 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  I have encouraged him to consider whether or not he      wants to continue this or not.     Arturo Morton. Riley Kill, MD, [facility]  Electronically Signed    TDS/MedQ  DD: 02/12/2007  DT: 02/12/2007  Job #: [id number]

## 2010-08-24 NOTE — Cardiovascular Report (Signed)
NAME:  Mitchell Sloan, Mitchell Sloan NO.:  [id number]   MEDICAL RECORD NO.:  [id number]          PATIENT TYPE:  OIB   LOCATION:  6526                         FACILITY:  [facility]   PHYSICIAN:  Mitchell Sloan. Mitchell Sloan, M.D. [facility] OF BIRTH:  28-Jan-1928   DATE OF PROCEDURE:  01/18/2004  DATE OF DISCHARGE:                              CARDIAC CATHETERIZATION   INDICATIONS:  Mitchell Sloan is a delightful 77 year old well-known to me.  He  has had prior revascularization surgery in 1981, and also in 1992.  At that  time he had a saphenous vein graft sequentially to two obtuse marginal  branches, saphenous vein graft to the distal right coronary artery and an  internal mammary to the left anterior descending artery.  He recently has  developed some progressive symptoms and because of this has been referred  for further evaluation.  On his last catheterization in 2002, the distal  right coronary beyond the graft insertion site was occluded and the graft  was diffusely diseased in the right vein graft.  The distal right was  collateralized.  The vein graft sequentially to the OM2 and 4 was widely  patent, the internal mammary graft was also patent.  Because of some  progressive symptoms, he presents for reevaluation.   PROCEDURE:  1.  Left heart catheterization.  2.  Selective coronary arteriography.  3.  Selective left ventriculography.  4.  Saphenous vein graft angiography x2.  5.  Selective left internal mammary angiography x1.  6.  Distal aortography.   DESCRIPTION OF THE PROCEDURE:  The patient was brought to the  Catheterization Laboratory.  In the Day Cath area he had a creatinine of 1.8  and therefore he was given intravenous hydration.  He was subsequently  brought over many hours later with a creatinine of 1.7.  Through an anterior  puncture the right femoral artery was easily entered.  A 6 French sheath was  placed.  Views of the left and right coronary arteries were obtained.  Following this, vein graft angiography was performed followed by internal  mammary angiography, central aortic and left ventricular pressures were then  measured, a distal aortography was then performed with a small amount of  contrast.  We limited the contrast for the study.  All catheters were  subsequently removed and the femoral sheath taken out in the holding area  for direct hemostasis.  There were no obvious complications.   HEMODYNAMIC DATA:  1.  Initial central aortic pressure 186/79, mean 121.  2.  Left ventricular pressure 176/14.  3.  No gradient on pullback across the aortic valve.   ANGIOGRAPHIC DATA:  1.  The left main coronary artery was free of critical disease.  2.  The left anterior descending artery demonstrates diffuse 80% stenosis      proximally then total occlusion after the origin of the septal.  The      septal perforator itself provides collaterals to the PDA.  3.  The circumflex has diffuse disease leading into the first marginal of      about 70%.  The AV circumflex has 80%  narrowing.  There is a second and      fourth marginal which are grafted and there is evidence of competitive      filling.  There is 90% stenosis leading into the posterolateral OM4      area.  4.  The saphenous vein graft to the OM2 and OM4 is patent compared to the      previous study, however there is a new lesion of what appears to be a      __________  thrombotic material in the graft between the two distal      insertion sites.  This is clearly new from the previous study.  5.  The internal mammary of the distal LAD is widely patent.  6.  The right coronary artery is diffusely diseased and totally occluded      after the graft insertion, the graft fills retrograde and is a severely      diseased graft.  7.  Ventriculography in the RAO projection reveals preserved global systolic      function, no segmental abnormalities contracture identified.   CONCLUSIONS:  1.  Well-preserved  left ventricular function.  2.  Patent internal mammary to the distal left anterior descending artery.  3.  Patent saphenous vein graft to the OM2 and OM4 with severe disease in      the second limb.  4.  Severely diseased vein graft to the distal right which is totally      occluded beyond the graft insertion, chronically.   DISPOSITION:  The patient has had two prior revascularization surgeries.  His mammary remains intact.  The distal limb is a focal lesion, although it  will be difficult to protect it.  We may consider using an excisor in order  to reduce the risk of large MI.  We will preload the patient with Plavix.       TDS/MEDQ  D:  01/18/2004  T:  01/18/2004  Job:  [id number]

## 2010-08-24 NOTE — Assessment & Plan Note (Signed)
[facility]                              CARDIOLOGY OFFICE NOTE   Mitchell Sloan, Mitchell Sloan                      MRN:          [id number]  DATE:01/17/2006                            DOB:          Jun 24, 1927    Mitchell Sloan is in for followup.  He is doing well.  He is not having any chest  pain or shortness of breath.  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  His last myocardial perfusion study, which we reviewed previously,  demonstrated an improvement in the anterior wall.  Specifically, there was  on a mild decrease in activity in the anterior wall suggesting mild anterior  ischemia, but better than before.  Overall wall motion was normal.  Ejection  fraction was 62% and considered to be normal.  His abdominal ultrasound  suggested an increased size of abdominal aortic aneurysm.  As a result, I  have sent him to Dr. Madilyn Fireman and he has seen Dr. Liliane Bade back in followup.  We will see him back again in November.  They are also in the process of  doing a carotid study.  The patient has not been back to the [place] in the  interim, but is due for his laboratory studies at some point.   CURRENT MEDICATIONS:  1. Fluvastatin 20 mg daily.  2. Felodipine 10 mg daily.  3. Folic acid 1 mg daily.  4. Metoprolol 50 mg p.o. b.i.d.  5. Aspirin 160 mg daily.  6. Omeprazole 20 mg daily.   PHYSICAL EXAMINATION:  GENERAL:  He is an alert and oriented gentleman in no  acute distress.  Ileostomy site looks good.  VITAL SIGNS:  Blood pressure 120/60, pulse 60.  LUNGS:  Clear.  CARDIAC:  Regular rhythm.   Electrocardiogram demonstrates normal sinus rhythm and minor nonspecific T-  wave abnormality.  There is small inferior Q waves.   IMPRESSION:  1. Coronary artery disease, status post coronary artery bypass graft      surgery with two prior coronary bypass procedures with the last in      1992.  2. Borderline abnormal myocardial perfusion study.  3. Abdominal  aortic aneurysm.  4. Carotid stenosis which Dr. Madilyn Fireman will address on his next visit.   PLAN:  1. Return to clinic in 3 months.  2. Continue careful followup.       Arturo Morton. Riley Kill, MD, [facility]     TDS/MedQ  DD:  01/17/2006  DT:  01/20/2006  Job #:  [id number]

## 2010-08-24 NOTE — Cardiovascular Report (Signed)
NAME:  Mitchell Sloan, Mitchell Sloan NO.:  [id number]   MEDICAL RECORD NO.:  [id number]          PATIENT TYPE:  OIB   LOCATION:  NA                           FACILITY:  [facility]   PHYSICIAN:  Carole Binning, M.D. LHCDATE OF BIRTH:  1927/04/14   DATE OF PROCEDURE:  01/23/2004  DATE OF DISCHARGE:                              CARDIAC CATHETERIZATION   PROCEDURE PERFORMED:  Thrombectomy of the distal limb of a sequential  saphenous vein graft to the obtuse marginal 2/obtuse marginal 3 followed by  placement of a drug-eluting stent.  The Excizer thrombectomy device was  utilized.   INDICATION:  [name] is a 77 year old male who presented last week with  symptoms of progressive angina.  Cardiac catheterization performed by Dr.  Arturo Morton. Riley Kill was pertinent for a 75% stenosis with haziness and  probable thrombus in the distal sequential limb of a sequential saphenous  vein graft to the second and third obtuse marginal branches.  Because of  mild renal insufficiency, the patient's intervention was deferred to  minimize the contrast exposure.  He was also started on intravenous heparin.  He was brought back to the laboratory today for planned percutaneous  intervention.   PROCEDURAL NOTE:  A 7-French sheath was placed in the right femoral artery.  Heparin and Integrilin was administered per protocol.  We used a 7-French  LCB guiding catheter.  We utilized Visipaque contrast.  The patient was  pretreated with sodium bicarbonate per protocol.  An ASAHI soft coronary  guidewire was advanced under fluoroscopic guidance through the saphenous  vein graft into the distal portion of the third obtuse marginal branch.  We  then performed a thrombectomy with the Excizer thrombectomy device with  multiple passes performed.  This did result in improvement in the appearance  of the vessel.  We then positioned a 3.5 x 18.0-mm CYPHER drug-eluting stent  across the lesion and deployed this stent  at 11 atmospheres.  We then went  back with a 3.5 x 15.0-mm Quantum balloon and inflated this to 14  atmospheres in the proximal aspect of the stent, 16 atmospheres in the  distal aspect of the stent and 18 atmospheres in the midportion of the  stent.  Intermittent doses of intracoronary verapamil and nitroglycerin were  administered.  Final angiographic images were obtained revealing patency of  the saphenous vein graft with 0% residual stenosis at the stent site and  TIMI-3 flow into the distal vessel.   COMPLICATIONS:  None.   RESULTS:  Successful thrombectomy followed by placement of a drug-eluting  stent in the distal sequential limb of the saphenous vein graft to the  second and third obtuse marginal branches.  A thrombotic 75% stenosis was  reduced to 0% residual with TIMI-3 flow.   PLAN:  Integrilin will be continued overnight.  The patient will be treated  with Plavix for a minimum of 6 months and preferably 12 months.       MWP/MEDQ  D:  01/23/2004  T:  01/23/2004  Job:  [id number]   cc:   Arturo Morton. Riley Kill, M.D. [facility]  [facility] Cardiac Cath Lab

## 2010-08-24 NOTE — Assessment & Plan Note (Signed)
[facility]                              CARDIOLOGY OFFICE NOTE   Mitchell Sloan, Mitchell Sloan                      MRN:          [id number]  DATE:10/29/2005                            DOB:          10/12/1927    Mitchell Sloan is in today for a follow-up visit.  He is here to review the  results of his studies.  Patient underwent radionuclide imaging with an  Adenosine on October 03, 2005.  The study revealed normal contractility and  thickening of all of myocardial areas, with an ejection fraction of 61%.  There was some mild anterior ischemia, and compared to the previous study of  2004 it seemed as though it was less prominent than on that particular  study.  The patient last underwent catheterization in October of 2005 and at  that time had thrombectomy followed by placement of a drug eluding stent and  the distal sequential limb of this half is vein grafted a second and third  obtuse marginal branches.  A 75% stenosis was reduced to 0% with TIMI III  flow.  The patient had undergone catheterization and because of his renal  insufficiency, the intervention was put off.  The patient has also undergone  ultrasound imaging.  The ultrasound revealed an increase in the aortic  aneurysm dimensions to 3.6 and 4.1 cm with dilated bilateral common iliac  arteries.  An ultrasound previously at the [place] had suggested a distal  aneurysm of 3.1 cm squared.  With this expansion, the patient has been  asymptomatic but we have recommended followup with the renal service.  The  patient also has evidence of renal insufficiency.   At the present time, we will make a referral to CVTS Service to reevaluate  Mitchell Sloan's abdominal aneurysm with Dr. Madilyn Fireman.  I will see him back in  followup in a couple of months, and should he have any increasing symptoms,  he is to call us.                              Mitchell Sloan. Riley Kill, MD, [facility]    TDS/MedQ  DD:  11/18/2005  DT:  11/18/2005  Job #:  [id number]

## 2010-08-24 NOTE — Discharge Summary (Signed)
NAME:  Mitchell Sloan, Mitchell Sloan NO.:  [id number]   MEDICAL RECORD NO.:  [id number]          PATIENT TYPE:  OIB   LOCATION:  6522                         FACILITY:  [facility]   PHYSICIAN:  Arturo Morton. Riley Kill, M.D. [facility] OF BIRTH:  May 21, 1927   DATE OF ADMISSION:  01/18/2004  DATE OF DISCHARGE:  01/24/2004                           DISCHARGE SUMMARY - REFERRING   DISCHARGE DIAGNOSIS:  1.  Coronary artery disease status post percutaneous coronary to the      saphenous vein graft to the second and third obtuse marginal branches      with a drug eluding stent.  2.  Ulcerative colitis status post colon resection.  3.  Hypercholesterolemia, treated.  4.  Mild renal insufficiency (check BMP at next visit).  5.  Known coronary artery disease with history of bypass surgery in 1981 and      1992.  6.  Allergies to codeine and Darvon.   HOSPITAL COURSE:  Mitchell Sloan is a 77 year old male patient with a known  history of bypass surgery in 1981 with a redo CABG in 1992.  He was admitted  on January 18, 2004, with worsening symptoms including shortness of breath  with exertion.  He underwent cardiac catheterization and was found to have  disease of the saphenous vein graft to the second and third obtuse  marginals.  He did have renal insufficiency with creatinine 1.8.  He was  hydrated, started on a bicarb drip, and underwent percutaneous intervention  utilizing a drug eluding stent on January 23, 2004.  He tolerated the  procedure well  and was ready for discharge on January 24, 2004.  He is  discharged to home on the following medications:  1.  Plavix 75 mg daily.  2.  Enteric coated aspirin 325 mg daily.  3.  He is to, otherwise, continue his home medications which include Lipitor      20  mg daily, Plendil 10 mg daily, folic acid 1 mg daily, sublingual      nitroglycerin p.r.n. chest pain.   DISCHARGE INSTRUCTIONS:  No straining or lifting over 10 pounds for one  week.  Remain on  low fat diet.  Call for questions or concerns.  Dr.  Rosalyn Charters office will call for a return appointment.  The patient will  return to the office in 1-2 weeks.  Check a BMP at this time.  He does have  some renal insufficiency.  He states he needs to stay well hydrated with a  colostomy, however, he feels like he has not been well hydrated as well as  he normally does at home.   LABORATORY DATA:  Creatinine 1.7, BUN 19, potassium 4.1.  Hemoglobin 11.6,  hematocrit 32.9, platelets 188, white count 6.5.  An EKG shows normal sinus  rhythm, rate 60, no acute ST-T wave changes.  He does have Q wave inversion  in 1 and L which is a change from previous EKG.       LB/MEDQ  D:  01/23/2004  T:  01/23/2004  Job:  [id number]

## 2010-08-24 NOTE — Assessment & Plan Note (Signed)
[facility]                            CARDIOLOGY OFFICE NOTE   Mitchell Sloan                      MRN:          [id number]  DATE:05/01/2006                            DOB:          1928/01/14    Mitchell Sloan is in for a followup visit.  He brought in some laboratory  studies from the [place], which they said we might want to change.  However,  the major finding is that his creatinine is elevated as it has been in  the past, and he is scheduled to see the nephrologist.  He also has an  elevated LDL cholesterol at 102.  He is being seen by Dr. Sherrie Mustache  at the [facility].  The patient is on fluvastatin.  We previously had him on  Lipitor, but this was changed to fluvastatin by the [place], and I have  therefore deferred management of his hypercholesterolemia to them.  His  LDL was elevated at 102 on the last visit with normal transaminases.   CURRENT MEDICATIONS:  1. Fluvastatin 20 mg daily.  2. Felodipine 10 mg daily.  3. Folic acid 1 mg daily.  4. Metoprolol 50 mg b.i.d.  5. Aspirin 325 mg one half tablet daily.  6. Omeprazole 20 mg daily.  7. Multivitamin.   PHYSICAL EXAMINATION:  The blood pressure is 144/62.  Pulse 71.  LUNG FIELDS:  Clear.  CARDIAC:  Rhythm is regular.  PMI is nondisplaced.   Recent abdominal aneurysm demonstrates an infrarenal AAA of 3.8 cm x 4.1  cm.  Recent laboratory:  His BUN, creatinine is 30 and 2.2 respectively.   EKG today reveals normal sinus rhythm with sinus arrhythmia.   IMPRESSION:  1. Coronary artery disease status post coronary artery bypass graft      surgery with 2 prior bypass operations, the last in 1992.  2. Borderline abnormal myocardial perfusion study.  3. Abdominal aortic aneurysm.  4. Carotid stenosis followed by Dr. Madilyn Fireman, who also follows the      abdominal aneurysm.  5. Hypercholesterolemia, under management by the [facility] in [facility].   PLAN:  1. Return to clinic in 3 to 6 months.  2. Agree with recommendations to see renal again.     Mitchell Sloan. Mitchell Kill, MD, [facility]  Electronically Signed    TDS/MedQ  DD: 05/02/2006  DT: 05/02/2006  Job #: [id number]   cc:   Sherrie Mustache

## 2010-08-24 NOTE — Assessment & Plan Note (Signed)
[facility]                            CARDIOLOGY OFFICE NOTE   Mitchell Sloan, Mitchell Sloan EMMERSON TADDEI                      MRN:          [id number]  DATE:01/19/2008                            DOB:          12/30/1927    [name] is in for followup.  He underwent a Myoview imaging study.  To  briefly summarize, the Myoview perfusion scan reveals an ejection  fraction of 66%.  There is no significant ST segment change and there is  moderate ischemia in the anterior wall.  He has been feeling sort of  less vigorous.  He has stable dimensions of the infrarenal abdominal  aortic aneurysm at 4.1 and bilateral common iliac artery aneurysms.  We  have recommended a followup in about 6 months.  With regard to his  overall situation, we have discussed this in some detail.  He, his wife  and I reviewed possible options.  He has had prior cardiac  catheterization.  He had a patent mammary to the LAD.  He does have  renal insufficiency, and creatinines have been in the 1.82 range.  We  reviewed the scan which does show a fairly focal defect.  We talked  about the possibility of catheterization versus continued medical  therapy.  The patient has just reached a 32 birthday and given these  findings, we have talked about the possibility of  just continuing him  on medical therapy.  Importantly, he had somewhat similar defect noted  in 2007.  We will continue to follow him closely.  I will see him on a  regular basis.     Arturo Morton. Riley Kill, MD, [facility]  Electronically Signed    TDS/MedQ  DD: 01/19/2008  DT: 01/19/2008  Job #: [id number]

## 2010-08-24 NOTE — Cardiovascular Report (Signed)
[place]  Patient:    Mitchell Sloan, Mitchell Sloan                      MRN: [id number] Proc. Date: 06/26/00 Adm. Date:  [id number] Disc. Date: [id number] Attending:  Ronaldo Miyamoto CC:         CV[facility] Lab  Otho Ket, M.D., [place], [place]   Cardiac Catheterization  INDICATIONS:  Mitchell Sloan is a delightful 77 year old gentleman well known to me. He has had two prior bypass operations. He has had some angina associated with eating and exercise in combination and this has been slightly worse. Previously, he had a lesion distal to the right graft insertion site that we elected to treat medically because of the small vessel size. With the recurrent angina, he has presented and we have recommended recurrent catheterization.  PROCEDURES PERFORMED: 1. Selective coronary arteriography. 2. Saphenous vein graft angiography. 3. Selective left internal mammary artery angiography.  DESCRIPTION OF PROCEDURE:  The procedure is performed from the right femoral artery using #6 French catheters. He tolerated the procedure well. There were no complications. We elected not to do ventriculogram because of his upper normal creatinine.  HEMODYNAMIC DATA: 1. The central aortic pressure was 146/66. 2. Left ventricular pressure 115/17. 3. There was no gradient on pullback across the aortic valve.  ANGIOGRAPHIC DATA: 1. Left main coronary artery: The left main coronary artery was free of    critical disease. 2. Left anterior descending artery:  The left anterior descending artery    had an 80% area of stenosis beyond the left anterior descending artery    takeoff. The left anterior descending artery itself was totally    occluded. The septal perforators supplied a collateral to the    posterior descending branch. As noted, this septal perforator had    about 80% proximal narrowing and was diffusely irregular. 3. Left circumflex coronary artery:  The circumflex provides  a first    marginal branch that has mild luminal irregularity, but is free of    critical disease. The vessel just beyond this has about 90%    narrowing and the second marginal is totally occluded. There is    subtotal occlusion of the AV circumflex distally. 4. Saphenous vein graft to second obtuse marginal branch and posterolateral    branch or third obtuse marginal branch:  Widely patent. 5. Right coronary artery:  The right coronary artery is diffusely diseased    in its distal portion and then totally occluded beyond the graft    insertion. The vein graft fills retrograde on the initial right    injection and there is evidence of an 80% mid stenosis. This is    documented during antegrade injection as well. As previously noted,    the posterior descending artery fills from collaterals from the    septal perforator. Also as previously noted, the internal mammary    inserts into the distal left anterior descending artery and is patent.  CONCLUSIONS: 1. Continued patency of the internal mammary artery to the left    anterior    descending artery. 2. Continued patency of the saphenous vein graft to the obtuse marginal    and distal circumflex. 3. Progressive disease in the saphenous vein graft to the distal right    as well as occlusion of the vessel distally with left to right    collaterals noted.  DISPOSITION:  Based on the findings, my inclination will be to  continue to treat him medically. He does have collateralization of the distal right coronary artery. He has had two prior bypass surgeries and given his age, it would be best to optimize medical management. DD:  06/26/00 TD:  06/27/00 Job: [id number]

## 2010-09-20 ENCOUNTER — Encounter: Payer: Self-pay | Admitting: Cardiology

## 2010-10-18 ENCOUNTER — Encounter: Payer: Self-pay | Admitting: Cardiology

## 2010-12-13 ENCOUNTER — Encounter (INDEPENDENT_AMBULATORY_CARE_PROVIDER_SITE_OTHER): Payer: Medicare Other | Admitting: *Deleted

## 2010-12-13 DIAGNOSIS — I714 Abdominal aortic aneurysm, without rupture: Secondary | ICD-10-CM

## 2011-01-08 ENCOUNTER — Encounter: Payer: Self-pay | Admitting: Vascular Surgery

## 2011-01-08 NOTE — Procedures (Unsigned)
DUPLEX ULTRASOUND OF ABDOMINAL AORTA  INDICATION:  Abdominal aortic aneurysm.  HISTORY: Diabetes:  Yes. Cardiac:  MI. Hypertension:  Yes. Smoking:  Previous. Connective Tissue Disorder: Family History:  No. Previous Surgery:  History of an ileostomy.  DUPLEX EXAM:         AP (cm)                   TRANSVERSE (cm) Proximal             2.4 cm                    2.9 cm Mid                  2.3 cm                    2.8 cm Distal               4.1 cm                    4.2 cm Right Iliac          1.3 cm                    1.3 cm Left Iliac           1.4 cm                    1.2 cm  PREVIOUS:  Date:  12/14/2009  AP:  4.2  TRANSVERSE:  4.2  IMPRESSION:  Aneurysmal dilatation of the distal abdominal aorta with no significant change noted when compared to the previous exam on 12/14/2009.  ___________________________________________ Janetta Hora. Fields, MD  CH/MEDQ  D:  12/14/2010  T:  12/14/2010  Job:  [id number]

## 2011-01-11 ENCOUNTER — Other Ambulatory Visit: Payer: Self-pay | Admitting: Vascular Surgery

## 2011-01-11 DIAGNOSIS — I714 Abdominal aortic aneurysm, without rupture: Secondary | ICD-10-CM

## 2011-10-07 ENCOUNTER — Other Ambulatory Visit: Payer: Self-pay | Admitting: Cardiology

## 2011-10-07 ENCOUNTER — Encounter (INDEPENDENT_AMBULATORY_CARE_PROVIDER_SITE_OTHER): Payer: Medicare Other

## 2011-10-07 DIAGNOSIS — R0989 Other specified symptoms and signs involving the circulatory and respiratory systems: Secondary | ICD-10-CM

## 2011-10-07 DIAGNOSIS — I6529 Occlusion and stenosis of unspecified carotid artery: Secondary | ICD-10-CM

## 2011-10-30 ENCOUNTER — Ambulatory Visit (INDEPENDENT_AMBULATORY_CARE_PROVIDER_SITE_OTHER): Payer: Medicare Other | Admitting: Cardiology

## 2011-10-30 ENCOUNTER — Encounter: Payer: Self-pay | Admitting: Cardiology

## 2011-10-30 VITALS — BP 140/73 | HR 79 | Ht 70.0 in | Wt 186.0 lb

## 2011-10-30 DIAGNOSIS — I714 Abdominal aortic aneurysm, without rupture: Secondary | ICD-10-CM

## 2011-10-30 DIAGNOSIS — I25810 Atherosclerosis of coronary artery bypass graft(s) without angina pectoris: Secondary | ICD-10-CM

## 2011-10-30 DIAGNOSIS — I059 Rheumatic mitral valve disease, unspecified: Secondary | ICD-10-CM

## 2011-10-30 DIAGNOSIS — I6529 Occlusion and stenosis of unspecified carotid artery: Secondary | ICD-10-CM

## 2011-10-30 DIAGNOSIS — I2510 Atherosclerotic heart disease of native coronary artery without angina pectoris: Secondary | ICD-10-CM

## 2011-10-30 MED ORDER — CARVEDILOL 12.5 MG PO TABS: 12.5000 mg | ORAL_TABLET | Freq: Two times a day (BID) | ORAL | Status: DC

## 2011-10-30 NOTE — Progress Notes (Signed)
HPI:  Patient is in for followup. From a cardiac standpoint he notes that when he goes out tries to walk very far he gets pretty fatigued. He sometimes will have  a little neck  discomfort,. He's not having severe chest pain. He underwent cardiac catheterization 2000, and he and I reviewed the results of this.  The patient also noted that he was painting and restaining his deck, was down, and when he got up he nearly passed out.  He became hot.   That went away and did not return, but it scared him.  He had not been drinking much as well.    Current Outpatient Prescriptions  Medication Sig Dispense Refill  . amLODipine (NORVASC) 10 MG tablet Take 10 mg by mouth daily.        [name] aspirin 81 MG tablet Take 81 mg by mouth daily.        [name] atorvastatin (LIPITOR) 80 MG tablet Take 40 mg by mouth daily.      . calcitRIOL (ROCALTROL) 0.25 MCG capsule Take 0.25 mcg by mouth. Take 1 capsule M-W-F and Saturdays       . carvedilol (COREG) 3.125 MG tablet Take 3.125 mg by mouth. Take 3 tablets twice a day      . desonide (DESOWEN) 0.05 % cream Apply topically as needed.        . Doxylamine Succinate, Sleep, (SLEEP AID) 25 MG tablet Take 25 mg by mouth at bedtime as needed.        . Dutasteride-Tamsulosin HCl 0.5-0.4 MG CAPS Take 1 tablet by mouth daily.        . folic acid (FOLVITE) 800 MCG tablet Take 800 mcg by mouth daily.        . isosorbide mononitrate (IMDUR) 30 MG 24 hr tablet Take 30 mg by mouth daily.        [name] omeprazole (PRILOSEC) 20 MG capsule Take 20 mg by mouth daily.        . potassium citrate (UROCIT-K) 10 MEQ (1080 MG) SR tablet Take 10 mEq by mouth 2 (two) times daily.        . solifenacin (VESICARE) 5 MG tablet Take 5 mg by mouth daily.          Allergies  Allergen Reactions  . Codeine   . Oxycodone   . Propoxyphene Hcl     Past Medical History  Diagnosis Date  . Hypertension   . Nephrolithiasis     hx of  . Prostate cancer   . Hypercholesterolemia   . Carotid stenosis   .  AAA (abdominal aortic aneurysm)   . Renal insufficiency   . Coronary artery disease     Past Surgical History  Procedure Date  . Coronary artery bypass graft 1992    x 3  . Prior surgery 1981    Family History  Problem Relation Age of Onset  . Heart attack Father 53    died    History   Social History  . Marital Status: Married    Spouse Name: N/A    Number of Children: 3  . Years of Education: N/A   Occupational History  . Not on file.   Social History Main Topics  . Smoking status: Never Smoker   . Smokeless tobacco: Not on file  . Alcohol Use: No  . Drug Use: No  . Sexually Active: Not on file   Other Topics Concern  . Not on file   Social History Narrative  .  No narrative on file    ROS: Please see the HPI.  All other systems reviewed and negative.  PHYSICAL EXAM:  BP 140/73  Pulse 79  Ht 5\' 10"  (1.778 m)  Wt 186 lb (84.369 kg)  BMI 26.69 kg/m2  General: Well developed, well nourished, in no acute distress. Head:  Normocephalic and atraumatic. Neck: no JVD.  Loud bilateral carotid bruits.   Lungs: Clear to auscultation and percussion. Heart: Normal S1 and S2. Apical holosystolic murmur.   Abdomen:  Normal bowel sounds; soft; non tender; no organomegaly Pulses: Pulses normal in all 4 extremities. Extremities: No clubbing or cyanosis. No edema. Neurologic: Alert and oriented x 3.  EKG:  NSr.  Inferior MI, old.  ECHO 2011 Study Conclusions  - Left ventricle: The cavity size was normal. There was mild concentric hypertrophy. Systolic function was normal. The estimated ejection fraction was in the range of 55% to 60%. Wall motion was normal; there were no regional wall motion abnormalities. Features are consistent with a pseudonormal left ventricular filling pattern, with concomitant abnormal relaxation and increased filling pressure (grade 2 diastolic dysfunction). - Mitral valve: Mild to moderate regurgitation.        ASSESSMENT AND  PLAN:

## 2011-10-30 NOTE — Patient Instructions (Signed)
Your physician has recommended you make the following change in your medication: INCREASE Carvedilol to 12.5mg  take one by mouth twice a day  Your physician recommends that you schedule a follow-up appointment in: 4 WEEKS with Dr Riley Kill

## 2011-11-01 ENCOUNTER — Encounter: Payer: Self-pay | Admitting: Cardiology

## 2011-11-03 NOTE — Assessment & Plan Note (Signed)
The patient has an intact IMA to the LAD with small distal vessel, a patent OM graft, and then an occluded RCA graft.  The LAD leading in to the septal, a source of collaterals, is also diseased.  Have recommended continued medical therpay.

## 2011-11-03 NOTE — Assessment & Plan Note (Signed)
Followed at the [place].  Cr is elevated.  Prior cath was in the hospital for a long time for hydration.

## 2011-11-03 NOTE — Assessment & Plan Note (Addendum)
Has bruits and we reviewed results today.  Has 60-79 on RICA, and 40-50 LICA.  The RICA has progressed.  Needs six month follow up.

## 2011-11-03 NOTE — Assessment & Plan Note (Signed)
Continue to monitor

## 2011-11-03 NOTE — Assessment & Plan Note (Addendum)
Last follow up at [facility]--4.2 by 4.2--due in fall at [facility].

## 2011-11-27 ENCOUNTER — Ambulatory Visit (INDEPENDENT_AMBULATORY_CARE_PROVIDER_SITE_OTHER): Payer: Medicare Other | Admitting: Cardiology

## 2011-11-27 ENCOUNTER — Encounter: Payer: Self-pay | Admitting: Cardiology

## 2011-11-27 VITALS — BP 132/71 | HR 76 | Ht 70.0 in | Wt 184.0 lb

## 2011-11-27 DIAGNOSIS — I6529 Occlusion and stenosis of unspecified carotid artery: Secondary | ICD-10-CM

## 2011-11-27 DIAGNOSIS — I25810 Atherosclerosis of coronary artery bypass graft(s) without angina pectoris: Secondary | ICD-10-CM

## 2011-11-27 DIAGNOSIS — N183 Chronic kidney disease, stage 3 unspecified: Secondary | ICD-10-CM

## 2011-11-27 DIAGNOSIS — I10 Essential (primary) hypertension: Secondary | ICD-10-CM

## 2011-11-27 NOTE — Patient Instructions (Addendum)
Your physician recommends that you schedule a follow-up appointment in: 3-4 MONTHS with Dr Riley Kill  Your physician recommends that you continue on your current medications as directed. Please refer to the Current Medication list given to you today.  STOP Zithromax.

## 2011-11-27 NOTE — Progress Notes (Signed)
HPI:  Patient seen in followup. He is doing very well. He did have an episode of bronchitis, and was seen in his primary care office yesterday. He was started on azithromycin, as received 2 tablets. I reviewed some of the findings from the FDA with the patient.  He also stopped taking his cholesterol medicine, largely in part because he felt poorly and is feeling much better after having stopped. He is not really crazy about restarting  Current Outpatient Prescriptions  Medication Sig Dispense Refill  . amLODipine (NORVASC) 10 MG tablet Take 10 mg by mouth daily.        [name] aspirin 81 MG tablet Take 81 mg by mouth daily.        [name] atorvastatin (LIPITOR) 80 MG tablet Take 40 mg by mouth daily.      [name] azithromycin (ZITHROMAX) 500 MG tablet Take 500 mg by mouth daily.      . calcitRIOL (ROCALTROL) 0.25 MCG capsule Take 0.25 mcg by mouth. Take 1 capsule M-W-F and Saturdays       . carvedilol (COREG) 12.5 MG tablet Take 1 tablet (12.5 mg total) by mouth 2 (two) times daily with a meal.  180 tablet  3  . desonide (DESOWEN) 0.05 % cream Apply topically as needed.        . Doxylamine Succinate, Sleep, (SLEEP AID) 25 MG tablet Take 25 mg by mouth at bedtime as needed.        . Dutasteride-Tamsulosin HCl 0.5-0.4 MG CAPS Take 1 tablet by mouth daily.        . folic acid (FOLVITE) 800 MCG tablet Take 800 mcg by mouth daily.        . isosorbide mononitrate (IMDUR) 30 MG 24 hr tablet Take 30 mg by mouth daily.        [name] omeprazole (PRILOSEC) 20 MG capsule Take 20 mg by mouth daily.        . potassium citrate (UROCIT-K) 10 MEQ (1080 MG) SR tablet Take 10 mEq by mouth 2 (two) times daily.        . solifenacin (VESICARE) 5 MG tablet Take 5 mg by mouth daily.          Allergies  Allergen Reactions  . Codeine   . Oxycodone   . Propoxyphene Hcl     Past Medical History  Diagnosis Date  . Hypertension   . Nephrolithiasis     hx of  . Prostate cancer   . Hypercholesterolemia   . Carotid stenosis   .  AAA (abdominal aortic aneurysm)   . Renal insufficiency   . Coronary artery disease     Past Surgical History  Procedure Date  . Coronary artery bypass graft 1992    x 3  . Prior surgery 1981    Family History  Problem Relation Age of Onset  . Heart attack Father 64    died    History   Social History  . Marital Status: Married    Spouse Name: N/A    Number of Children: 3  . Years of Education: N/A   Occupational History  . Not on file.   Social History Main Topics  . Smoking status: Never Smoker   . Smokeless tobacco: Not on file  . Alcohol Use: No  . Drug Use: No  . Sexually Active: Not on file   Other Topics Concern  . Not on file   Social History Narrative  . No narrative on file  ROS: Please see the HPI.  All other systems reviewed and negative.  PHYSICAL EXAM:  BP 132/71  Pulse 76  Ht 5\' 10"  (1.778 m)  Wt 184 lb (83.462 kg)  BMI 26.40 kg/m2  General: Well developed, well nourished, in no acute distress. Head:  Normocephalic and atraumatic. Neck: no JVD Lungs: Clear to auscultation and percussion. Heart: Normal S1 and S2.  No murmur, rubs or gallops.  Pulses: Pulses normal in all 4 extremities. Extremities: No clubbing or cyanosis. No edema. Neurologic: Alert and oriented x 3.  EKG:  NSR.  Inferior MI, old.    ASSESSMENT AND PLAN:

## 2011-11-28 ENCOUNTER — Telehealth: Payer: Self-pay | Admitting: Cardiology

## 2011-11-28 NOTE — Telephone Encounter (Signed)
Please return call to patient [phone] regarding medication questions

## 2011-11-28 NOTE — Telephone Encounter (Signed)
The pt wanted to make sure that he can take omnicef.

## 2011-11-28 NOTE — Telephone Encounter (Signed)
I spoke with Dr Riley Kill and Mitchell Sloan is okay from a QT prolongation standpoint. Pt aware.

## 2011-12-23 NOTE — Assessment & Plan Note (Signed)
He is being followed by his primary care MD.  BUN28/Cr 2.3 in July.  Otherwise stable.  Continue to monitor.

## 2011-12-23 NOTE — Assessment & Plan Note (Signed)
He has multiple potential sources of ischemia as noted on last cath study.  He has controlled symtpoms, although they are worse.  SVG to the RCA is occluded, and SVG to the OM is aged with distal limb occluded.  RCA supplied by septal which is compromised by LAD, and LIMA is open ,but distal LAD disease.  Has CKD on top, with increased age.  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  He told me he has stopped.  Continued medical therapy is warranted given age, options, and serum creatinine.  We will continue to follow him medically.

## 2011-12-23 NOTE — Assessment & Plan Note (Signed)
He should have repeated carotid in January.

## 2011-12-25 ENCOUNTER — Encounter: Payer: Self-pay | Admitting: Neurosurgery

## 2011-12-26 ENCOUNTER — Ambulatory Visit (INDEPENDENT_AMBULATORY_CARE_PROVIDER_SITE_OTHER): Payer: Medicare Other | Admitting: Neurosurgery

## 2011-12-26 ENCOUNTER — Encounter (INDEPENDENT_AMBULATORY_CARE_PROVIDER_SITE_OTHER): Payer: Medicare Other | Admitting: *Deleted

## 2011-12-26 ENCOUNTER — Encounter: Payer: Self-pay | Admitting: Neurosurgery

## 2011-12-26 VITALS — BP 151/79 | HR 66 | Resp 18 | Ht 69.0 in | Wt 184.1 lb

## 2011-12-26 DIAGNOSIS — I714 Abdominal aortic aneurysm, without rupture: Secondary | ICD-10-CM

## 2011-12-26 NOTE — Addendum Note (Signed)
Addended by: Sharee Pimple on: 12/26/2011 10:50 AM   Modules accepted: Orders

## 2011-12-26 NOTE — Progress Notes (Signed)
[facility] AAA/PAD/PVD Office Note  CC: Annual AAA surveillance Referring Physician: Fields  History of Present Illness: 78 year old male patient of Dr. Darrick Penna followed for known AAA. The patient denies any unusual back or abdominal pain. Patient states he has had some vertigo when he arises quickly as well as poor stamina, I explained to the patient that this may be due to hypertensive problems, his blood pressure today is 151/79. The patient will followup with his primary care doctor regarding this.  Past Medical History  Diagnosis Date  . Hypertension   . Nephrolithiasis     hx of  . Prostate cancer   . Hypercholesterolemia   . Carotid stenosis   . AAA (abdominal aortic aneurysm)   . Renal insufficiency   . Coronary artery disease     ROS: [x]  Positive   [ ]  Denies    General: [ ]  Weight loss, [ ]  Fever, [ ]  chills Neurologic: [ ]  Dizziness, [ ]  Blackouts, [ ]  Seizure [ ]  Stroke, [ ]  "Mini stroke", [ ]  Slurred speech, [ ]  Temporary blindness; [ ]  weakness in arms or legs, [ ]  Hoarseness Cardiac: [ ]  Chest pain/pressure, [ ]  Shortness of breath at rest [ ]  Shortness of breath with exertion, [ ]  Atrial fibrillation or irregular heartbeat Vascular: [ ]  Pain in legs with walking, [ ]  Pain in legs at rest, [ ]  Pain in legs at night,  [ ]  Non-healing ulcer, [ ]  Blood clot in vein/DVT,   Pulmonary: [ ]  Home oxygen, [ ]  Productive cough, [ ]  Coughing up blood, [ ]  Asthma,  [ ]  Wheezing Musculoskeletal:  [ ]  Arthritis, [ ]  Low back pain, [ ]  Joint pain Hematologic: [ ]  Easy Bruising, [ ]  Anemia; [ ]  Hepatitis Gastrointestinal: [ ]  Blood in stool, [ ]  Gastroesophageal Reflux/heartburn, [ ]  Trouble swallowing Urinary: [ ]  chronic Kidney disease, [ ]  on HD - [ ]  MWF or [ ]  TTHS, [ ]  Burning with urination, [ ]  Difficulty urinating Skin: [ ]  Rashes, [ ]  Wounds Psychological: [ ]  Anxiety, [ ]  Depression   Social History History  Substance Use Topics  .  Smoking status: Never Smoker   . Smokeless tobacco: Not on file  . Alcohol Use: No    Family History Family History  Problem Relation Age of Onset  . Heart attack Father 73    died  . Heart disease Father     Allergies  Allergen Reactions  . Codeine   . Oxycodone   . Propoxyphene Hcl     Current Outpatient Prescriptions  Medication Sig Dispense Refill  . amLODipine (NORVASC) 10 MG tablet Take 10 mg by mouth daily.        [name] aspirin 81 MG tablet Take 81 mg by mouth daily.        [name] atorvastatin (LIPITOR) 80 MG tablet Take 40 mg by mouth daily.      . calcitRIOL (ROCALTROL) 0.25 MCG capsule Take 0.25 mcg by mouth. Take 1 capsule M-W-F and Saturdays       . carvedilol (COREG) 12.5 MG tablet Take 1 tablet (12.5 mg total) by mouth 2 (two) times daily with a meal.  180 tablet  3  . Doxylamine Succinate, Sleep, (SLEEP AID) 25 MG tablet Take 25 mg by mouth 3 (three) times daily - between meals and at bedtime.       . Dutasteride-Tamsulosin HCl 0.5-0.4 MG CAPS Take 1 tablet by mouth daily.        [name]  folic acid (FOLVITE) 800 MCG tablet Take 800 mcg by mouth daily.        . isosorbide mononitrate (IMDUR) 30 MG 24 hr tablet Take 30 mg by mouth daily.        [name] omeprazole (PRILOSEC) 20 MG capsule Take 20 mg by mouth daily.        . potassium citrate (UROCIT-K) 10 MEQ (1080 MG) SR tablet Take 10 mEq by mouth 2 (two) times daily.        [name] desonide (DESOWEN) 0.05 % cream Apply topically as needed.        . solifenacin (VESICARE) 5 MG tablet Take 5 mg by mouth daily.          Physical Examination  Filed Vitals:   12/26/11 0950  BP: 151/79  Pulse: 66  Resp: 18    Body mass index is 27.19 kg/(m^2).  General:  WDWN in NAD Gait: Normal HEENT: WNL Eyes: Pupils equal Pulmonary: normal non-labored breathing , without Rales, rhonchi,  wheezing Cardiac: RRR, without  Murmurs, rubs or gallops; No carotid bruits Abdomen: soft, NT, no masses Skin: no rashes, ulcers noted Vascular Exam/Pulses:  2+ radial pulses bilaterally, no carotid bruits are heard, pulsatile abdominal mass palpated  Extremities without ischemic changes, no Gangrene , no cellulitis; no open wounds;  Musculoskeletal: no muscle wasting or atrophy  Neurologic: A&O X 3; Appropriate Affect ; SENSATION: normal; MOTOR FUNCTION:  moving all extremities equally. Speech is fluent/normal  Non-Invasive Vascular Imaging: Maximum AAA size today via duplex is 3.9 x 4.4 which is relatively stable with previous exam one year ago when he was 4.1 x 4.2  ASSESSMENT/PLAN: Asymptomatic known AAA patient, the patient will followup in one year with repeat AAA duplex. The patient is in agreement with this, his questions were encouraged and answered.  Lauree Chandler ANP  Clinic M.D.: Fields

## 2012-03-11 ENCOUNTER — Ambulatory Visit: Payer: Medicare Other | Admitting: Cardiology

## 2012-03-13 ENCOUNTER — Ambulatory Visit (INDEPENDENT_AMBULATORY_CARE_PROVIDER_SITE_OTHER): Payer: Medicare Other | Admitting: Cardiology

## 2012-03-13 ENCOUNTER — Encounter: Payer: Self-pay | Admitting: Cardiology

## 2012-03-13 VITALS — BP 144/70 | HR 70 | Ht 70.0 in | Wt 186.6 lb

## 2012-03-13 DIAGNOSIS — N183 Chronic kidney disease, stage 3 unspecified: Secondary | ICD-10-CM

## 2012-03-13 DIAGNOSIS — I10 Essential (primary) hypertension: Secondary | ICD-10-CM

## 2012-03-13 DIAGNOSIS — I25810 Atherosclerosis of coronary artery bypass graft(s) without angina pectoris: Secondary | ICD-10-CM

## 2012-03-13 DIAGNOSIS — I714 Abdominal aortic aneurysm, without rupture, unspecified: Secondary | ICD-10-CM

## 2012-03-13 DIAGNOSIS — I059 Rheumatic mitral valve disease, unspecified: Secondary | ICD-10-CM

## 2012-03-13 NOTE — Assessment & Plan Note (Signed)
He will send [place] his most recent numbers.

## 2012-03-13 NOTE — Addendum Note (Signed)
Addended by: Iona Coach on: 03/13/2012 03:06 PM   Modules accepted: Orders

## 2012-03-13 NOTE — Assessment & Plan Note (Signed)
Most recent 4.4 by 3.9.  Being followed by [facility].

## 2012-03-13 NOTE — Assessment & Plan Note (Signed)
Mild elevation.  He will continue to monitor.

## 2012-03-13 NOTE — Progress Notes (Addendum)
HPI:  Patient seen today in followup. From cardiac standpoint he actually is about the same. He's not had any advancement of his symptoms. He does note he is slowing down somewhat, and is concerned that his wife has some depression. She has fibromyalgia, and to some extent limits with they are able to do.  No current chest pain per se.  Should be noted that he thinks his joints are better off of cholesterol meds.  Now taking Red Yeast Rice.    Current Outpatient Prescriptions  Medication Sig Dispense Refill  . amLODipine (NORVASC) 10 MG tablet Take 10 mg by mouth daily.        [name] aspirin 81 MG tablet Take 81 mg by mouth daily.        [name] atorvastatin (LIPITOR) 80 MG tablet Take 40 mg by mouth daily.      . calcitRIOL (ROCALTROL) 0.25 MCG capsule Take 0.25 mcg by mouth. Take 1 capsule M-W-F and Saturdays       . carvedilol (COREG) 12.5 MG tablet Take 1 tablet (12.5 mg total) by mouth 2 (two) times daily with a meal.  180 tablet  3  . desonide (DESOWEN) 0.05 % cream Apply topically as needed.        . Doxylamine Succinate, Sleep, (SLEEP AID) 25 MG tablet Take 25 mg by mouth 3 (three) times daily - between meals and at bedtime.       . Dutasteride-Tamsulosin HCl 0.5-0.4 MG CAPS Take 1 tablet by mouth daily.        . folic acid (FOLVITE) 800 MCG tablet Take 800 mcg by mouth daily.        . isosorbide mononitrate (IMDUR) 30 MG 24 hr tablet Take 30 mg by mouth daily.        [name] omeprazole (PRILOSEC) 20 MG capsule Take 20 mg by mouth daily.        . potassium citrate (UROCIT-K) 10 MEQ (1080 MG) SR tablet Take 10 mEq by mouth 2 (two) times daily.        . Red Yeast Rice 600 MG CAPS Take 1 capsule by mouth 2 (two) times daily.      . solifenacin (VESICARE) 5 MG tablet Take 5 mg by mouth daily.          Allergies  Allergen Reactions  . Codeine   . Oxycodone   . Propoxyphene Hcl     Past Medical History  Diagnosis Date  . Hypertension   . Nephrolithiasis     hx of  . Prostate cancer   .  Hypercholesterolemia   . Carotid stenosis   . AAA (abdominal aortic aneurysm)   . Renal insufficiency   . Coronary artery disease     Past Surgical History  Procedure Date  . Coronary artery bypass graft 1992    x 3  . Prior surgery 1981    Family History  Problem Relation Age of Onset  . Heart attack Father 2    died  . Heart disease Father     History   Social History  . Marital Status: Married    Spouse Name: N/A    Number of Children: 3  . Years of Education: N/A   Occupational History  . Not on file.   Social History Main Topics  . Smoking status: Never Smoker   . Smokeless tobacco: Not on file  . Alcohol Use: No  . Drug Use: No  . Sexually Active: Not on file  Other Topics Concern  . Not on file   Social History Narrative  . No narrative on file    ROS: Please see the HPI.  All other systems reviewed and negative.  PHYSICAL EXAM:  BP 144/70  Pulse 70  Ht 5\' 10"  (1.778 m)  Wt 186 lb 9.6 oz (84.641 kg)  BMI 26.77 kg/m2  SpO2 99%  General: Well developed, well nourished, in no acute distress. Head:  Normocephalic and atraumatic. Neck: no JVD Lungs: Clear to auscultation and percussion. Heart: Normal S1 and S2. Soft apical murmur.   Abdomen:  Normal bowel sounds; soft; non tender; no organomegaly Pulses: Pulses normal in all 4 extremities. Extremities: No clubbing or cyanosis. No edema. Neurologic: Alert and oriented x 3.  EKG:  NSR.  Inferior MI, age undetermined.  No change from August.   ASSESSMENT AND PLAN:

## 2012-03-13 NOTE — Patient Instructions (Addendum)
Your physician recommends that you schedule a follow-up appointment in: 3 MONTHS with Dr Stuckey  Your physician recommends that you continue on your current medications as directed. Please refer to the Current Medication list given to you today.  

## 2012-03-13 NOTE — Assessment & Plan Note (Signed)
No change in current symptoms.

## 2012-03-25 ENCOUNTER — Emergency Department (HOSPITAL_COMMUNITY): Payer: Medicare Other

## 2012-03-25 ENCOUNTER — Encounter (HOSPITAL_COMMUNITY): Payer: Self-pay | Admitting: Emergency Medicine

## 2012-03-25 ENCOUNTER — Inpatient Hospital Stay (HOSPITAL_COMMUNITY)
Admission: EM | Admit: 2012-03-25 | Discharge: 2012-03-28 | DRG: 247 | Disposition: A | Discharge status: Home / Self Care | Payer: Medicare Other | Attending: Cardiology | Admitting: Cardiology

## 2012-03-25 ENCOUNTER — Telehealth: Payer: Self-pay | Admitting: Cardiology

## 2012-03-25 DIAGNOSIS — I2510 Atherosclerotic heart disease of native coronary artery without angina pectoris: Secondary | ICD-10-CM | POA: Diagnosis present

## 2012-03-25 DIAGNOSIS — Z888 Allergy status to other drugs, medicaments and biological substances status: Secondary | ICD-10-CM

## 2012-03-25 DIAGNOSIS — I200 Unstable angina: Secondary | ICD-10-CM

## 2012-03-25 DIAGNOSIS — Z7902 Long term (current) use of antithrombotics/antiplatelets: Secondary | ICD-10-CM

## 2012-03-25 DIAGNOSIS — I252 Old myocardial infarction: Secondary | ICD-10-CM

## 2012-03-25 DIAGNOSIS — I10 Essential (primary) hypertension: Secondary | ICD-10-CM | POA: Diagnosis present

## 2012-03-25 DIAGNOSIS — I249 Acute ischemic heart disease, unspecified: Secondary | ICD-10-CM

## 2012-03-25 DIAGNOSIS — Z79899 Other long term (current) drug therapy: Secondary | ICD-10-CM

## 2012-03-25 DIAGNOSIS — Z7982 Long term (current) use of aspirin: Secondary | ICD-10-CM

## 2012-03-25 DIAGNOSIS — Z8546 Personal history of malignant neoplasm of prostate: Secondary | ICD-10-CM

## 2012-03-25 DIAGNOSIS — N039 Chronic nephritic syndrome with unspecified morphologic changes: Secondary | ICD-10-CM | POA: Diagnosis present

## 2012-03-25 DIAGNOSIS — I714 Abdominal aortic aneurysm, without rupture, unspecified: Secondary | ICD-10-CM | POA: Diagnosis present

## 2012-03-25 DIAGNOSIS — N184 Chronic kidney disease, stage 4 (severe): Secondary | ICD-10-CM

## 2012-03-25 DIAGNOSIS — I25810 Atherosclerosis of coronary artery bypass graft(s) without angina pectoris: Secondary | ICD-10-CM | POA: Diagnosis present

## 2012-03-25 DIAGNOSIS — E119 Type 2 diabetes mellitus without complications: Secondary | ICD-10-CM | POA: Diagnosis present

## 2012-03-25 DIAGNOSIS — I129 Hypertensive chronic kidney disease with stage 1 through stage 4 chronic kidney disease, or unspecified chronic kidney disease: Secondary | ICD-10-CM | POA: Diagnosis present

## 2012-03-25 DIAGNOSIS — I214 Non-ST elevation (NSTEMI) myocardial infarction: Principal | ICD-10-CM | POA: Diagnosis present

## 2012-03-25 DIAGNOSIS — K219 Gastro-esophageal reflux disease without esophagitis: Secondary | ICD-10-CM

## 2012-03-25 DIAGNOSIS — Z87891 Personal history of nicotine dependence: Secondary | ICD-10-CM

## 2012-03-25 DIAGNOSIS — E7800 Pure hypercholesterolemia, unspecified: Secondary | ICD-10-CM | POA: Diagnosis present

## 2012-03-25 DIAGNOSIS — Z8249 Family history of ischemic heart disease and other diseases of the circulatory system: Secondary | ICD-10-CM

## 2012-03-25 DIAGNOSIS — Z955 Presence of coronary angioplasty implant and graft: Secondary | ICD-10-CM

## 2012-03-25 DIAGNOSIS — E785 Hyperlipidemia, unspecified: Secondary | ICD-10-CM | POA: Diagnosis present

## 2012-03-25 DIAGNOSIS — D631 Anemia in chronic kidney disease: Secondary | ICD-10-CM | POA: Diagnosis present

## 2012-03-25 DIAGNOSIS — Z87442 Personal history of urinary calculi: Secondary | ICD-10-CM

## 2012-03-25 DIAGNOSIS — Z9861 Coronary angioplasty status: Secondary | ICD-10-CM

## 2012-03-25 DIAGNOSIS — I6529 Occlusion and stenosis of unspecified carotid artery: Secondary | ICD-10-CM | POA: Diagnosis present

## 2012-03-25 HISTORY — DX: Headache: R51

## 2012-03-25 HISTORY — DX: Ulcerative colitis, unspecified, without complications: K51.90

## 2012-03-25 HISTORY — DX: Unspecified malignant neoplasm of skin, unspecified: C44.90

## 2012-03-25 HISTORY — DX: Bronchitis, not specified as acute or chronic: J40

## 2012-03-25 HISTORY — DX: Unspecified osteoarthritis, unspecified site: M19.90

## 2012-03-25 LAB — CBC WITH DIFFERENTIAL/PLATELET
Basophils Relative: 0 % (ref 0–1)
Eosinophils Absolute: 0.1 10*3/uL (ref 0.0–0.7)
Eosinophils Relative: 2 % (ref 0–5)
Hemoglobin: 11.1 g/dL — ABNORMAL LOW (ref 13.0–17.0)
Lymphs Abs: 1.8 10*3/uL (ref 0.7–4.0)
MCH: 32.6 pg (ref 26.0–34.0)
MCHC: 34 g/dL (ref 30.0–36.0)
MCV: 95.9 fL (ref 78.0–100.0)
Monocytes Relative: 6 % (ref 3–12)
Platelets: 199 10*3/uL (ref 150–400)
RBC: 3.4 MIL/uL — ABNORMAL LOW (ref 4.22–5.81)

## 2012-03-25 LAB — COMPREHENSIVE METABOLIC PANEL
Albumin: 3.8 g/dL (ref 3.5–5.2)
BUN: 35 mg/dL — ABNORMAL HIGH (ref 6–23)
Calcium: 9.1 mg/dL (ref 8.4–10.5)
GFR calc Af Amer: 29 mL/min — ABNORMAL LOW (ref >90)
Glucose, Bld: 195 mg/dL — ABNORMAL HIGH (ref 70–99)
Total Protein: 7.8 g/dL (ref 6.0–8.3)

## 2012-03-25 LAB — POCT I-STAT TROPONIN I: Troponin i, poc: 0.09 ng/mL (ref 0.00–0.08)

## 2012-03-25 LAB — TROPONIN I: Troponin I: 0.82 ng/mL (ref <0.30)

## 2012-03-25 MED ORDER — ASPIRIN 81 MG PO TABS: 81.0000 mg | ORAL_TABLET | Freq: Every day | ORAL | Status: DC

## 2012-03-25 MED ORDER — CARVEDILOL 25 MG PO TABS
25.0000 mg | ORAL_TABLET | Freq: Two times a day (BID) | ORAL | Status: DC
  Administered 2012-03-25 – 2012-03-27 (×5): 25 mg via ORAL
  Filled 2012-03-25 (×9): qty 1

## 2012-03-25 MED ORDER — ASPIRIN 81 MG PO CHEW
324.0000 mg | CHEWABLE_TABLET | Freq: Once | ORAL | Status: AC
  Administered 2012-03-25: 324 mg via ORAL
  Filled 2012-03-25: qty 4

## 2012-03-25 MED ORDER — DIAZEPAM 5 MG PO TABS: 5.0000 mg | ORAL_TABLET | ORAL | Status: DC

## 2012-03-25 MED ORDER — AMLODIPINE BESYLATE 10 MG PO TABS
10.0000 mg | ORAL_TABLET | Freq: Every day | ORAL | Status: DC
  Administered 2012-03-26 – 2012-03-27 (×2): 10 mg via ORAL
  Filled 2012-03-25 (×3): qty 1

## 2012-03-25 MED ORDER — ASPIRIN 81 MG PO CHEW
324.0000 mg | CHEWABLE_TABLET | ORAL | Status: AC
  Administered 2012-03-26: 324 mg via ORAL

## 2012-03-25 MED ORDER — ASPIRIN 81 MG PO CHEW
324.0000 mg | CHEWABLE_TABLET | ORAL | Status: AC
  Filled 2012-03-25: qty 4

## 2012-03-25 MED ORDER — FOLIC ACID 1 MG PO TABS
1.0000 mg | ORAL_TABLET | Freq: Every day | ORAL | Status: DC
  Administered 2012-03-26 – 2012-03-27 (×2): 1 mg via ORAL
  Filled 2012-03-25 (×3): qty 1

## 2012-03-25 MED ORDER — SALINE SPRAY 0.65 % NA SOLN
1.0000 | NASAL | Status: DC | PRN
  Administered 2012-03-25: 1 via NASAL
  Filled 2012-03-25 (×2): qty 44

## 2012-03-25 MED ORDER — POTASSIUM CITRATE ER 10 MEQ (1080 MG) PO TBCR
10.0000 meq | EXTENDED_RELEASE_TABLET | Freq: Two times a day (BID) | ORAL | Status: DC
  Administered 2012-03-26: 10 meq via ORAL
  Filled 2012-03-25 (×5): qty 1

## 2012-03-25 MED ORDER — CALCITRIOL 0.25 MCG PO CAPS: 0.2500 ug | ORAL_CAPSULE | ORAL | Status: DC

## 2012-03-25 MED ORDER — ACETAMINOPHEN 325 MG PO TABS
650.0000 mg | ORAL_TABLET | ORAL | Status: DC | PRN
  Administered 2012-03-26: 650 mg via ORAL
  Filled 2012-03-25: qty 2

## 2012-03-25 MED ORDER — SODIUM CHLORIDE 0.9 % IJ SOLN: 3.0000 mL | Freq: Two times a day (BID) | INTRAMUSCULAR | Status: DC

## 2012-03-25 MED ORDER — SODIUM CHLORIDE 0.9 % IV SOLN: 250.0000 mL | INTRAVENOUS | Status: DC | PRN

## 2012-03-25 MED ORDER — SODIUM CHLORIDE 0.9 % IJ SOLN: 3.0000 mL | INTRAMUSCULAR | Status: DC | PRN

## 2012-03-25 MED ORDER — CLOPIDOGREL BISULFATE 75 MG PO TABS
75.0000 mg | ORAL_TABLET | Freq: Every day | ORAL | Status: DC
  Administered 2012-03-26 – 2012-03-28 (×3): 75 mg via ORAL
  Filled 2012-03-25 (×3): qty 1

## 2012-03-25 MED ORDER — CALCITRIOL 0.25 MCG PO CAPS
0.2500 ug | ORAL_CAPSULE | ORAL | Status: DC
  Administered 2012-03-27: 0.25 ug via ORAL
  Filled 2012-03-25 (×2): qty 1

## 2012-03-25 MED ORDER — CLOPIDOGREL BISULFATE 300 MG PO TABS
300.0000 mg | ORAL_TABLET | Freq: Once | ORAL | Status: AC
  Administered 2012-03-25: 300 mg via ORAL
  Filled 2012-03-25: qty 1

## 2012-03-25 MED ORDER — ASPIRIN 300 MG RE SUPP
300.0000 mg | RECTAL | Status: AC
  Filled 2012-03-25: qty 1

## 2012-03-25 MED ORDER — CLOPIDOGREL BISULFATE 300 MG PO TABS
300.0000 mg | ORAL_TABLET | Freq: Every day | ORAL | Status: DC
  Filled 2012-03-25: qty 1

## 2012-03-25 MED ORDER — ALPRAZOLAM 0.25 MG PO TABS: 0.2500 mg | ORAL_TABLET | Freq: Two times a day (BID) | ORAL | Status: DC | PRN

## 2012-03-25 MED ORDER — ZOLPIDEM TARTRATE 5 MG PO TABS
5.0000 mg | ORAL_TABLET | Freq: Every evening | ORAL | Status: DC | PRN
  Administered 2012-03-25 – 2012-03-28 (×3): 5 mg via ORAL
  Filled 2012-03-25 (×3): qty 1

## 2012-03-25 MED ORDER — ATORVASTATIN CALCIUM 20 MG PO TABS: 20.0000 mg | ORAL_TABLET | Freq: Every day | ORAL | Status: DC

## 2012-03-25 MED ORDER — ASPIRIN EC 81 MG PO TBEC
81.0000 mg | DELAYED_RELEASE_TABLET | Freq: Every day | ORAL | Status: DC
Start: 2012-03-27 — End: 2012-03-28
  Administered 2012-03-27: 81 mg via ORAL
  Filled 2012-03-25 (×2): qty 1

## 2012-03-25 MED ORDER — ISOSORBIDE MONONITRATE ER 30 MG PO TB24
30.0000 mg | ORAL_TABLET | Freq: Every day | ORAL | Status: DC
  Administered 2012-03-26 – 2012-03-27 (×2): 30 mg via ORAL
  Filled 2012-03-25 (×3): qty 1

## 2012-03-25 MED ORDER — PANTOPRAZOLE SODIUM 40 MG PO TBEC
40.0000 mg | DELAYED_RELEASE_TABLET | Freq: Every day | ORAL | Status: DC
  Administered 2012-03-26 – 2012-03-27 (×2): 40 mg via ORAL
  Filled 2012-03-25: qty 1

## 2012-03-25 MED ORDER — NITROGLYCERIN 0.4 MG SL SUBL: 0.4000 mg | SUBLINGUAL_TABLET | SUBLINGUAL | Status: DC | PRN

## 2012-03-25 MED ORDER — TAMSULOSIN HCL 0.4 MG PO CAPS
0.4000 mg | ORAL_CAPSULE | Freq: Every day | ORAL | Status: DC
  Administered 2012-03-26 – 2012-03-27 (×2): 0.4 mg via ORAL
  Filled 2012-03-25 (×3): qty 1

## 2012-03-25 MED ORDER — HEPARIN (PORCINE) IN NACL 100-0.45 UNIT/ML-% IJ SOLN
1400.0000 [IU]/h | INTRAMUSCULAR | Status: DC
  Administered 2012-03-25: 1200 [IU]/h via INTRAVENOUS
  Administered 2012-03-26: 1400 [IU]/h via INTRAVENOUS
  Filled 2012-03-25 (×4): qty 250

## 2012-03-25 MED ORDER — ONDANSETRON HCL 4 MG/2ML IJ SOLN
4.0000 mg | Freq: Four times a day (QID) | INTRAMUSCULAR | Status: DC | PRN
  Administered 2012-03-27: 22:00:00 4 mg via INTRAVENOUS
  Filled 2012-03-25: qty 2

## 2012-03-25 MED ORDER — ASPIRIN EC 81 MG PO TBEC: 81.0000 mg | DELAYED_RELEASE_TABLET | Freq: Every day | ORAL | Status: DC

## 2012-03-25 MED ORDER — FOLIC ACID 800 MCG PO TABS: 800.0000 ug | ORAL_TABLET | Freq: Every day | ORAL | Status: DC

## 2012-03-25 MED ORDER — HEPARIN BOLUS VIA INFUSION
4000.0000 [IU] | Freq: Once | INTRAVENOUS | Status: AC
  Administered 2012-03-25: 4000 [IU] via INTRAVENOUS

## 2012-03-25 MED ORDER — RED YEAST RICE 600 MG PO CAPS: 1.0000 | ORAL_CAPSULE | Freq: Two times a day (BID) | ORAL | Status: DC

## 2012-03-25 MED ORDER — SODIUM CHLORIDE 0.9 % IV SOLN
INTRAVENOUS | Status: DC
  Administered 2012-03-25 – 2012-03-26 (×2): via INTRAVENOUS

## 2012-03-25 MED ORDER — ROSUVASTATIN CALCIUM 10 MG PO TABS
10.0000 mg | ORAL_TABLET | Freq: Every day | ORAL | Status: DC
  Administered 2012-03-25 – 2012-03-27 (×3): 10 mg via ORAL
  Filled 2012-03-25 (×5): qty 1

## 2012-03-25 MED ORDER — DARIFENACIN HYDROBROMIDE ER 7.5 MG PO TB24
7.5000 mg | ORAL_TABLET | Freq: Every day | ORAL | Status: DC
  Administered 2012-03-26 – 2012-03-27 (×2): 7.5 mg via ORAL
  Filled 2012-03-25 (×3): qty 1

## 2012-03-25 NOTE — ED Provider Notes (Signed)
History     CSN: [id number]  Arrival date & time 03/25/12  1243   First MD Initiated Contact with Patient 03/25/12 1306      Chief Complaint  Patient presents with  . Chest Pain    (Consider location/radiation/quality/duration/timing/severity/associated sxs/prior treatment) HPI Comments: Mitchell Sloan is a 78 y.o. Male  who is here for evaluation of chest pain. He describes it as burning, chest pain in the left anterior chest radiating to the left jaw. It sometimes resolves with rest, and has also required nitroglycerin. He used his first nitroglycerin, 2 days ago. He used it this morning, as well. He presents to the ED, pain-free. He has had some belching, which seems to coincide with the pain as well. There are no other associated symptoms. He sees his cardiologist regularly. He took an 81 mg aspirin this morning. There are no other modifying factors.  The history is provided by the patient.    Past Medical History  Diagnosis Date  . Hypertension   . Hypercholesterolemia   . Coronary artery disease   . Carotid stenosis   . AAA (abdominal aortic aneurysm)   . Anginal pain   . Myocardial infarction 1992  . Bronchitis     "3 or 4 times in my life; last time was 1990's" (03/25/2012)  . Borderline diabetes     "not taking any RX; CBG in am 136-140's" (03/25/2012)  . Ulcerative colitis     "severe; that's what prompted the ileostomy, etc" (03/25/2012)  . Headache     "used to have them alot before first heart OR; none since" (03/25/2012)  . Arthritis     "nonconfirmed in my right shoulder" (03/25/2012)  . Nephrolithiasis     hx of  . Renal insufficiency     "[facility] dr tells me my kidneys are working 30%" (03/25/2012)  . Prostate cancer 1996    "seed implants" (03/25/2012)  . Skin cancer     "several frozen off head" (03/25/2012)    Past Surgical History  Procedure Date  . Coronary artery bypass graft 1981; 1992    "X 3; X4" (03/25/2012)  . Prior surgery 1981  .  Abdominoperineal proctocolectomy 1996    "w/Brooks ileostomy" (03/25/2012)  . Appendectomy     "when I was a little boy" (03/25/2012)  . Radioactive seed implant 1996    "for prostate cancer" (03/25/2012)  . Transurethral resection of bladder tumor ?2011  . Skin cancer excision 1990's?    "off my back" (03/25/2012)  . Lithotripsy ~ 2000  . Coronary angioplasty with stent placement ~ 2010    "1" (03/25/2012)  . Cardiac catheterization     "probably 6; never had a balloon" (03/25/2012)    Family History  Problem Relation Age of Onset  . Heart attack Father 43    died  . Heart disease Father     History  Substance Use Topics  . Smoking status: Former Smoker -- 1.0 packs/day for 20 years    Types: Cigarettes  . Smokeless tobacco: Never Used     Comment: 03/25/2012 "stopped smoking 1960's"  . Alcohol Use: Yes     Comment: 03/25/2012 "might have 6 beers a year"      Review of Systems  All other systems reviewed and are negative.    Allergies  Atorvastatin; Codeine; Oxycodone; and Propoxyphene hcl  Home Medications   No current outpatient prescriptions on file.  BP 127/68  Pulse 73  Temp 97.7 F (36.5 C) (Oral)  Resp 18  Ht 5\' 10"  (1.778 m)  Wt 182 lb 15.7 oz (83 kg)  BMI 26.26 kg/m2  SpO2 97%  Physical Exam  Nursing note and vitals reviewed. Constitutional: He is oriented to person, place, and time. He appears well-developed and well-nourished.  HENT:  Head: Normocephalic and atraumatic.  Right Ear: External ear normal.  Left Ear: External ear normal.  Eyes: Conjunctivae normal and EOM are normal. Pupils are equal, round, and reactive to light.  Neck: Normal range of motion and phonation normal. Neck supple.  Cardiovascular: Normal rate, regular rhythm, normal heart sounds and intact distal pulses.   Pulmonary/Chest: Effort normal and breath sounds normal. He exhibits no bony tenderness.  Abdominal: Soft. Normal appearance. There is no tenderness.   Musculoskeletal: Normal range of motion.  Neurological: He is alert and oriented to person, place, and time. He has normal strength. No cranial nerve deficit or sensory deficit. He exhibits normal muscle tone. Coordination normal.  Skin: Skin is warm, dry and intact.  Psychiatric: He has a normal mood and affect. His behavior is normal. Judgment and thought content normal.    ED Course  Procedures (including critical care time)  Patient pain-free in ED, he did not require aggressive treatment  Patient treated with aspirin, full-strength  Consultation with cardiology, who came to see the patient and admitted the patient   Date: 01/24/2012  Rate: 85  Rhythm: normal sinus rhythm  QRS Axis: normal  PR and QT Intervals: normal  ST/T Wave abnormalities: nonspecific ST/T changes  PR and QRS Conduction Disutrbances:none  Narrative Interpretation:   Old EKG Reviewed: unchanged   Labs Reviewed  CBC WITH DIFFERENTIAL - Abnormal; Notable for the following:    RBC 3.40 (*)     Hemoglobin 11.1 (*)     HCT 32.6 (*)     All other components within normal limits  COMPREHENSIVE METABOLIC PANEL - Abnormal; Notable for the following:    Sodium 133 (*)     Glucose, Bld 195 (*)     BUN 35 (*)     Creatinine, Ser 2.26 (*)     GFR calc non Af Amer 25 (*)     GFR calc Af Amer 29 (*)     All other components within normal limits  POCT I-STAT TROPONIN I - Abnormal; Notable for the following:    Troponin i, poc 0.09 (*)     All other components within normal limits  HEPARIN LEVEL (UNFRACTIONATED) - Abnormal; Notable for the following:    Heparin Unfractionated 0.28 (*)     All other components within normal limits  TROPONIN I - Abnormal; Notable for the following:    Troponin I 0.82 (*)     All other components within normal limits  TROPONIN I - Abnormal; Notable for the following:    Troponin I 1.03 (*)     All other components within normal limits  HEMOGLOBIN A1C - Abnormal; Notable for  the following:    Hemoglobin A1C 6.8 (*)     Mean Plasma Glucose 148 (*)     All other components within normal limits  BASIC METABOLIC PANEL - Abnormal; Notable for the following:    Sodium 133 (*)     Glucose, Bld 122 (*)     BUN 32 (*)     Creatinine, Ser 2.21 (*)     GFR calc non Af Amer 26 (*)     GFR calc Af Amer 30 (*)     All  other components within normal limits  CBC - Abnormal; Notable for the following:    RBC 3.19 (*)     Hemoglobin 10.6 (*)     HCT 30.6 (*)     All other components within normal limits  LIPID PANEL - Abnormal; Notable for the following:    Triglycerides 315 (*)     HDL 29 (*)     VLDL 63 (*)     All other components within normal limits  TSH  TROPONIN I  PROTIME-INR   Dg Chest 2 View  03/25/2012  *RADIOLOGY REPORT*  Clinical Data: Chest pain.  CHEST - 2 VIEW  Comparison: January 18, 2004.  Findings: Sternotomy wires are noted.  Cardiomediastinal silhouette appears normal.  Mild scarring is again noted in the lingular region.  No acute pulmonary disease is noted.  IMPRESSION: No acute cardiopulmonary abnormality seen.   Original Report Authenticated By: Lupita Raider.,  M.D.    Nursing notes, applicable records and vitals reviewed.  Radiologic Images/Reports reviewed.   1. Acute coronary syndrome   2. Coronary atherosclerosis of native coronary artery       MDM  Chest pain , consistent with unstable angina and acute coronary syndrome. He remained pain-free in the ED, and did not require aggressive treatments. He is admitted to cardiology for further evaluation        Flint Melter, MD 03/26/12 [phone]

## 2012-03-25 NOTE — ED Notes (Signed)
Results of troponin shown to Dr. Effie Shy

## 2012-03-25 NOTE — ED Notes (Signed)
MD at bedside. 

## 2012-03-25 NOTE — ED Notes (Signed)
Pt back from x-ray.

## 2012-03-25 NOTE — H&P (Signed)
History and Physical   Patient ID: Mitchell Sloan MRN: [id number], DOB/AGE: 1928-04-04   Admit date: 03/25/2012 Date of Consult: 03/25/2012   Primary Physician: No primary provider on file. Primary Cardiologist: Shawnie Pons, MD  HPI: Mitchell Sloan is a 78yo male with PMHx s/f CAD (s/p CABG x 3 in 1992, multiple caths since- see below), diastolic dysfunction (on 2011 echo), CKD (stage III, baseline Cr 2.2-2.3), AAA (4.4 x 3.9 cm, followed by [facility]), CAS (nonobstructive), HTN and HLD who presents to [facility] ED with chest pain.   He has a significant prior cardiac history. He recently followed up with Dr. Riley Kill on 12/06. He was doing well from a cardiac standpoint. He has a very good handle of his symptoms. He part of a health club at [place] and is typically able to walk 2-3 laps around the track (1/10 mile) before developed substernal chest burning. This will relieve with several minutes of rest, and he is able to walk 4-5 more laps without incident. Over the past 2-3 days, he has noticed progressively worsening DOE and exertional chest burning with radiation to his neck and left arm to the point where walking 50 feet aggravated the discomfort. Each episode relieves with rest. He laid down last night, the chest pain recurred, he took 2 NTG with relief and fell asleep. He called the office today complaining of these symptoms and was advised to present to the ED.   There, EKG did reveal mildly more pronounced lateral ST depressions. POC TnI 0.09. CXR w/o acute abnormalities. CBC revealed hgb 11, hct 32.6. BMET with mild hyponatremia at 133. Cr 2.26. He has received a full-dose ASA. No chest pain since presenting to the ED.   Last cath 07/2009: continued patency of LIMA-LAD, SVG-OM w/ occlusion of the second limb to the OM3 with retrograde collateralization, RCA occlusion, SVG-RCA occlusion- old with collateralization to the PDA by retrograde collaterals, some progression of prox LAD disease  (70-80%) leading into the first septal perforators, which supply collaterals to the distal right. Medical therapy was elected as PCI came with increased risk for nephropathy and little anginal benefit.   Echo 04/2009: LVEF 55-60%, mild concentric hypertrophy, grade 2 diastolic dysfunction, mild-moderate MR.   Problem List: Past Medical History  Diagnosis Date  . Hypertension   . Nephrolithiasis     hx of  . Prostate cancer   . Hypercholesterolemia   . Carotid stenosis   . AAA (abdominal aortic aneurysm)   . Renal insufficiency   . Coronary artery disease     Past Surgical History  Procedure Date  . Coronary artery bypass graft 1992    x 3  . Prior surgery 1981     Allergies:  Allergies  Allergen Reactions  . Atorvastatin     myalgia  . Codeine   . Oxycodone   . Propoxyphene Hcl     Home Medications: Prior to Admission medications   Medication Sig Start Date End Date Taking? Authorizing Provider  amLODipine (NORVASC) 10 MG tablet Take 10 mg by mouth daily.     Yes Historical Provider, MD  aspirin 81 MG tablet Take 81 mg by mouth daily.     Yes Historical Provider, MD  calcitRIOL (ROCALTROL) 0.25 MCG capsule Take 0.25 mcg by mouth. Take 1 capsule M-W-F and Saturdays    Yes Historical Provider, MD  carvedilol (COREG) 12.5 MG tablet Take 1 tablet (12.5 mg total) by mouth 2 (two) times daily with a meal. 10/30/11  Yes Herby Abraham, MD  folic acid (FOLVITE) 800 MCG tablet Take 800 mcg by mouth daily.     Yes Historical Provider, MD  isosorbide mononitrate (IMDUR) 30 MG 24 hr tablet Take 30 mg by mouth daily.     Yes Historical Provider, MD  Multiple Vitamin (MULTIVITAMIN WITH MINERALS) TABS Take 1 tablet by mouth daily.   Yes Historical Provider, MD  omeprazole (PRILOSEC) 20 MG capsule Take 20 mg by mouth daily.     Yes Historical Provider, MD  potassium citrate (UROCIT-K) 10 MEQ (1080 MG) SR tablet Take 10 mEq by mouth 2 (two) times daily.     Yes Historical Provider, MD    Red Yeast Rice 600 MG CAPS Take 1 capsule by mouth 2 (two) times daily.   Yes Historical Provider, MD  rosuvastatin (CRESTOR) 10 MG tablet Take 10 mg by mouth daily.   Yes Historical Provider, MD  solifenacin (VESICARE) 5 MG tablet Take 5 mg by mouth daily.     Yes Historical Provider, MD  Tamsulosin HCl (FLOMAX) 0.4 MG CAPS Take 0.4 mg by mouth daily.   Yes Historical Provider, MD    Inpatient Medications:     . aspirin  324 mg Oral Once    (Not in a hospital admission)  Family History  Problem Relation Age of Onset  . Heart attack Father 74    died  . Heart disease Father      History   Social History  . Marital Status: Married    Spouse Name: N/A    Number of Children: 3  . Years of Education: N/A   Occupational History  . Not on file.   Social History Main Topics  . Smoking status: Never Smoker   . Smokeless tobacco: Not on file  . Alcohol Use: No  . Drug Use: No  . Sexually Active: Not on file   Other Topics Concern  . Not on file   Social History Narrative  . No narrative on file     Review of Systems: General: negative for chills, fever, night sweats or weight changes.  Cardiovascular: positive for chest pain, dyspnea on exertion, SOBnegative for edema, orthopnea, palpitations, paroxysmal nocturnal dyspnea Dermatological: negative for rash Respiratory: negative for cough or wheezing Urologic:  negative for hematuria Abdominal: negative for nausea, vomiting, diarrhea, bright red blood per rectum, melena, or hematemesis Neurologic:  negative for visual changes, syncope, or dizziness All other systems reviewed and are otherwise negative except as noted above.  Physical Exam: Blood pressure 151/63, pulse 80, temperature 97.9 F (36.6 C), resp. rate 19, SpO2 98.00%.    General: Well developed, well nourished, in no acute distress. Head: Normocephalic, atraumatic, sclera non-icteric, no xanthomas, nares are without discharge.  Neck: Positive for left  carotid bruit. Negative for right carotid bruits. JVD not elevated. Lungs: Clear bilaterally to auscultation without wheezes, rales, or rhonchi. Breathing is unlabored. Heart: RRR with S1 S2. No murmurs, rubs, or gallops appreciated. Abdomen:  Colostomy bag noted. No erythema or tenderness. Soft, non-tender, non-distended with normoactive bowel sounds. No hepatomegaly. No rebound/guarding. No obvious abdominal masses. Msk:  Strength and tone appears normal for age. Extremities: No clubbing, cyanosis or edema.  Distal pedal pulses are 2+ and equal bilaterally. Neuro: Alert and oriented X 3. Moves all extremities spontaneously. Psych:  Responds to questions appropriately with a normal affect.  Labs: Recent Labs  [facility] 03/25/12 1333   WBC 7.6   HGB 11.1*   HCT 32.6*  MCV 95.9   PLT 199   Lab 03/25/12 1333  NA 133*  K 4.4  CL 99  CO2 20  BUN 35*  CREATININE 2.26*  CALCIUM 9.1  PROT 7.8  BILITOT 0.3  ALKPHOS 57  ALT 18  AST 22  AMYLASE --  LIPASE --  GLUCOSE 195*   Radiology/Studies: Dg Chest 2 View  03/25/2012  *RADIOLOGY REPORT*  Clinical Data: Chest pain.  CHEST - 2 VIEW  Comparison: January 18, 2004.  Findings: Sternotomy wires are noted.  Cardiomediastinal silhouette appears normal.  Mild scarring is again noted in the lingular region.  No acute pulmonary disease is noted.  IMPRESSION: No acute cardiopulmonary abnormality seen.   Original Report Authenticated By: Lupita Raider.,  M.D.    EKG: NSR, 85 bpm, more prominent ST depression (1 mm) I, baseline ST depressions V5, V6, TWI AVL unchanged from prior tracings  ASSESSMENT AND PLAN:   1. CAD/ACS- patient's HPI and prior cardiac history support unstable angina. EKG shows mildly prominent lateral ST changes. POC TnI is borderline elevated. Discussed with Dr. Riley Kill who knows the patient well. He has notable poor PCI targets on most recent cath. Caths have been complicated by acute on CKD from contrast-induced  nephropathy requiring extended hospitalizations. Will plan to observe overnight. Gently hydrate. Cycle CEs. Clear liquids tomorrow. Dr. Riley Kill will see tomorrow AM. After discussing with Dr. Eden Emms, will tentatively place on board for cath tomorrow. Continue ASA, increase BB, continue statin, Imdur. NPO after breakfast. Will load with Plavix. Start heparin. Repeat 2D echo.   2. CKD, stage IV- Cr slightly above baseline. Gently hydrate overnight. Check BMET tomorrow.   3. AAA- stable. Followed by [facility].   4. HTN- continue outpatient antihypertensives. Will increase Coreg.   5. HLD- continue statin.   6. GERD- continue PPI.    Signed, R. Hurman Horn, PA-C 03/25/2012, 3:46 PM   Patient examined chart reviewed.  Previous bypass with accelerating angina.  Baseline Cr is 2.0-2.2 Last cath hydrated for 6 days and Cr stayed in this range.  Increase coreg ( HR 82) increase imdur to bid.  Not a candidate for redo bypass so start heparin and plavix.  Hydrate with NS overnight and hopefully cath tomorrow with TS.  Patient and wife understand risk of nephrotoxicity but he has managed angina for 1.5 years and now he has an accelerated pattern and will not likely be able to avoid cath.    Charlton Haws 5:40 PM 03/25/2012

## 2012-03-25 NOTE — ED Notes (Addendum)
Pt states he has some left chest pain and sob on exertion. Pt has a cardiac hx, states the pain was burning in nature and radiated to his left side of his neck. Pt denies pain at this time. Pt states he took 1 nitro at 9am it relieved his pain briefly, pt's wife states it may have been expired.

## 2012-03-25 NOTE — ED Notes (Addendum)
[facility] cardiology at bedside.  

## 2012-03-25 NOTE — Progress Notes (Signed)
ANTICOAGULATION CONSULT NOTE - Initial Consult  Pharmacy Consult for Heparin Indication: chest pain/ACS  Allergies  Allergen Reactions  . Atorvastatin     myalgia  . Codeine   . Oxycodone   . Propoxyphene Hcl     Patient Measurements:  From 03/13/12: Wt = 186.6 lbs, Ht 5"10" Heparin Dosing Weight: 85 kg  Vital Signs: Temp: 97.9 F (36.6 C) (12/18 1253) BP: 151/63 mmHg (12/18 1309) Pulse Rate: 80  (12/18 1253)  Labs:  [facility] 03/25/12 1333  HGB 11.1*  HCT 32.6*  PLT 199  APTT --  LABPROT --  INR --  HEPARINUNFRC --  CREATININE 2.26*  CKTOTAL --  CKMB --  TROPONINI --    The CrCl is unknown because both a height and weight (above a minimum accepted value) are required for this calculation.   Medical History: Past Medical History  Diagnosis Date  . Hypertension   . Nephrolithiasis     hx of  . Prostate cancer   . Hypercholesterolemia   . Carotid stenosis   . AAA (abdominal aortic aneurysm)   . Renal insufficiency   . Coronary artery disease     Assessment: 78 yo male with extensive cardiac history admitted with chest pain.  Pharmacy asked to begin anticoagulation with IV heparin.  Possibly going for cath in AM.  CBC okay for heparin dosing.  Spoke with pt, no hx bleeding and no anticoagulants PTA.  No anticoagulants PTA.  Goal of Therapy:  Heparin level 0.3-0.7 units/ml Monitor platelets by anticoagulation protocol: Yes   Plan:  1. Heparin 4000 unit IV bolus x 1. 2. Then start heparin gtt at 1200 units/hr. 3. Check heparin level 8 hrs after gtt starts. 4. Daily heparin level and CBC. 5. F/U plans for cath.  Gracelynne Benedict C 03/25/2012,5:12 PM

## 2012-03-25 NOTE — ED Notes (Signed)
Burning sensation in left neck left chest has  Cardiac hx got bad last night and would not go away w/ nitro  Took 1 nitro this am  No pain now while sitting but when he exerts  It does dr Tedra [place]

## 2012-03-25 NOTE — Telephone Encounter (Signed)
Pt states that he is having "burning around his heart and up into his left neck with SOB" when walking or with any exertion.  Been going on for 6 months but worse last night and today.  Symptoms subside with rest.  Denies N/V or diaphoresis.  Per Dr. Riley Kill, pt advised to go to the nearest ED which would be in [facility] and ask for a transfer to [facility].  Pt is reluctant to go to the ED and asked about driving himself to [facility].  I told pt that driving himself to [facility] would not be advised.  Pt understands and will call me if he indeed goes to the ER in [place] so I can facilitate a transfer to [facility].

## 2012-03-25 NOTE — Telephone Encounter (Signed)
Pt is SOB and burning around his heart

## 2012-03-25 NOTE — ED Notes (Signed)
Patient transported to X-ray 

## 2012-03-26 DIAGNOSIS — I214 Non-ST elevation (NSTEMI) myocardial infarction: Secondary | ICD-10-CM

## 2012-03-26 DIAGNOSIS — I10 Essential (primary) hypertension: Secondary | ICD-10-CM

## 2012-03-26 DIAGNOSIS — N183 Chronic kidney disease, stage 3 (moderate): Secondary | ICD-10-CM

## 2012-03-26 DIAGNOSIS — I059 Rheumatic mitral valve disease, unspecified: Secondary | ICD-10-CM

## 2012-03-26 LAB — HEPARIN LEVEL (UNFRACTIONATED)
Heparin Unfractionated: 0.28 IU/mL — ABNORMAL LOW (ref 0.30–0.70)
Heparin Unfractionated: 0.46 IU/mL (ref 0.30–0.70)

## 2012-03-26 LAB — GLUCOSE, CAPILLARY
Glucose-Capillary: 161 mg/dL — ABNORMAL HIGH (ref 70–99)
Glucose-Capillary: 114 mg/dL — ABNORMAL HIGH (ref 70–99)

## 2012-03-26 LAB — LIPID PANEL
Cholesterol: 186 mg/dL (ref 0–200)
Total CHOL/HDL Ratio: 6.4 RATIO
Triglycerides: 315 mg/dL — ABNORMAL HIGH (ref <150)

## 2012-03-26 LAB — CBC
MCH: 33.2 pg (ref 26.0–34.0)
Platelets: 181 10*3/uL (ref 150–400)
RBC: 3.19 MIL/uL — ABNORMAL LOW (ref 4.22–5.81)

## 2012-03-26 LAB — BASIC METABOLIC PANEL
Calcium: 8.7 mg/dL (ref 8.4–10.5)
GFR calc Af Amer: 30 mL/min — ABNORMAL LOW (ref >90)
GFR calc non Af Amer: 26 mL/min — ABNORMAL LOW (ref >90)
Glucose, Bld: 122 mg/dL — ABNORMAL HIGH (ref 70–99)
Potassium: 3.9 mEq/L (ref 3.5–5.1)
Sodium: 133 mEq/L — ABNORMAL LOW (ref 135–145)

## 2012-03-26 LAB — PROTIME-INR: INR: 1.03 (ref 0.00–1.49)

## 2012-03-26 LAB — TSH: TSH: 2.527 u[IU]/mL (ref 0.350–4.500)

## 2012-03-26 MED ORDER — INSULIN ASPART 100 UNIT/ML ~~LOC~~ SOLN
0.0000 [IU] | Freq: Three times a day (TID) | SUBCUTANEOUS | Status: DC
  Administered 2012-03-26 (×2): 2 [IU] via SUBCUTANEOUS
  Administered 2012-03-27: 1 [IU] via SUBCUTANEOUS

## 2012-03-26 MED ORDER — DIAZEPAM 5 MG PO TABS
5.0000 mg | ORAL_TABLET | ORAL | Status: AC
  Administered 2012-03-27: 5 mg via ORAL
  Filled 2012-03-26: qty 1

## 2012-03-26 NOTE — Progress Notes (Signed)
Patient: Mitchell Sloan / Admit Date: 03/25/2012 / Date of Encounter: 03/26/2012, 10:56 AM   Subjective  Ambulated in hallways without CP or SOB- but stamina is down. Urinating normally.   Objective   Telemetry: NSR, occ PVCs 4 beats reading as NSVT but different morphology from PVCs Physical Exam: Filed Vitals:   03/26/12 0515  BP: 127/68  Pulse: 73  Temp: 97.7 F (36.5 C)  Resp: 18   General: Well developed, well nourished WF in no acute distress. Head: Normocephalic, atraumatic, sclera non-icteric, no xanthomas, nares are without discharge. Neck: JVD not elevated. Lungs: Clear bilaterally to auscultation without wheezes, rales, or rhonchi. Breathing is unlabored. Heart: RRR S1 S2 without murmurs, rubs, or gallops.  Abdomen: Soft, non-tender, non-distended with normoactive bowel sounds. No hepatomegaly. No rebound/guarding. No obvious abdominal masses. Msk:  Strength and tone appear normal for age. Extremities: No clubbing or cyanosis. No edema.  Distal pedal pulses are 2+ and equal bilaterally. Neuro: Alert and oriented X 3. Moves all extremities spontaneously. Psych:  Responds to questions appropriately with a normal affect.    Intake/Output Summary (Last 24 hours) at 03/26/12 1056 Last data filed at 03/26/12 0900  Gross per 24 hour  Intake    300 ml  Output      0 ml  Net    300 ml    Inpatient Medications:    . amLODipine  10 mg Oral Daily  . aspirin  324 mg Oral NOW   Or  . aspirin  300 mg Rectal NOW  . aspirin EC  81 mg Oral Daily  . calcitRIOL  0.25 mcg Oral Custom  . carvedilol  25 mg Oral BID WC  . clopidogrel  75 mg Oral Q breakfast  . darifenacin  7.5 mg Oral Daily  . diazepam  5 mg Oral On Call  . diazepam  5 mg Oral On Call  . folic acid  1 mg Oral Daily  . isosorbide mononitrate  30 mg Oral Daily  . pantoprazole  40 mg Oral Daily  . potassium citrate  10 mEq Oral BID  . rosuvastatin  10 mg Oral q1800  . sodium chloride  3 mL Intravenous Q12H    . sodium chloride  3 mL Intravenous Q12H  . Tamsulosin HCl  0.4 mg Oral Daily    Labs:  [facility] 03/26/12 0237 03/25/12 1333  NA 133* 133*  K 3.9 4.4  CL 101 99  CO2 22 20  GLUCOSE 122* 195*  BUN 32* 35*  CREATININE 2.21* 2.26*  CALCIUM 8.7 9.1  MG -- --  PHOS -- --    Basename 03/25/12 1333  AST 22  ALT 18  ALKPHOS 57  BILITOT 0.3  PROT 7.8  ALBUMIN 3.8    Basename 03/26/12 0237 03/25/12 1333  WBC 6.1 7.6  NEUTROABS -- 5.3  HGB 10.6* 11.1*  HCT 30.6* 32.6*  MCV 95.9 95.9  PLT 181 199    Basename 03/26/12 0827 03/26/12 0237 03/25/12 2027  CKTOTAL -- -- --  CKMB -- -- --  TROPONINI 1.37* 1.03* 0.82*    Basename 03/25/12 2026  HGBA1C 6.8*    Basename 03/26/12 0237  CHOL 186  HDL 29*  LDLCALC 94  TRIG 147*  CHOLHDL 6.4    Radiology/Studies:  Dg Chest 2 View 03/25/2012  *RADIOLOGY REPORT*  Clinical Data: Chest pain.  CHEST - 2 VIEW  Comparison: January 18, 2004.  Findings: Sternotomy wires are noted.  Cardiomediastinal silhouette appears normal.  Mild scarring  is again noted in the lingular region.  No acute pulmonary disease is noted.  IMPRESSION: No acute cardiopulmonary abnormality seen.   Original Report Authenticated By: Lupita Raider.,  M.D.      Assessment and Plan  1. NSTEMI with history of CAD s/p CABG/PCI - per the cath lab, his procedure has been cancelled for today. Await MD decision regarding cath. Continue heparin, ASA, BB, statin, Imdur. 2. Chronic kidney disease stage III - Cr at baseline. Await decision regarding cath. 3. AAA 3.9x4.4 12/2011 - followed by vascular. 4. HTN - controlled. 5. Anemia - appears chronic by labs, question of chronic disease. Pt denies any bleeding. 6. Hyperlipidemia - statin. 7. New diabetes mellitus - has been borderline for years. A1C here is 6.8. Consult diabetes coordinator for education. He has glucometer at home. Consider starting low dose Amaryl. Add CBG checks and SSI.  Signed, Ronie Spies  PA-C   Patient seen and examined.  He has walked three times today and not had further symptoms.  His echo demonstrates normal LV function.  We plan to continue gentle hydration and reassess renal function in the am.  Other options would include radionuclide imaging for high risk anatomy.  Thorough discussion with patient.

## 2012-03-26 NOTE — Progress Notes (Signed)
ANTICOAGULATION CONSULT NOTE  Pharmacy Consult for Heparin Indication: chest pain/ACS  Allergies  Allergen Reactions  . Atorvastatin     myalgia  . Codeine   . Oxycodone   . Propoxyphene Hcl     Patient Measurements: Height: 5\' 10"  (177.8 cm) Weight: 184 lb 1.4 oz (83.5 kg) IBW/kg (Calculated) : 73  Heparin Dosing Weight: 85 kg  Vital Signs: Temp: 97.5 F (36.4 C) (12/18 2017) Temp src: Oral (12/18 2017) BP: 165/75 mmHg (12/18 2017) Pulse Rate: 80  (12/18 2017)  Labs:  Basename 03/26/12 0237 03/25/12 2027 03/25/12 1333  HGB 10.6* -- 11.1*  HCT 30.6* -- 32.6*  PLT 181 -- 199  APTT -- -- --  LABPROT -- -- --  INR -- -- --  HEPARINUNFRC 0.28* -- --  CREATININE 2.21* -- 2.26*  CKTOTAL -- -- --  CKMB -- -- --  TROPONINI 1.03* 0.82* --    Estimated Creatinine Clearance: 26.6 ml/min (by C-G formula based on Cr of 2.21).  Assessment: 78 yo male with chest pain for Heparin  Goal of Therapy:  Heparin level 0.3-0.7 units/ml Monitor platelets by anticoagulation protocol: Yes   Plan:  Increase Heparin 1400 units/hr F/U after cath  Eddie Candle 03/26/2012,5:05 AM

## 2012-03-26 NOTE — Plan of Care (Addendum)
Problem: Food- and Nutrition-Related Knowledge Deficit (NB-1.1) Goal: Nutrition education Formal process to instruct or train a patient/client in a skill or to impart knowledge to help patients/clients voluntarily manage or modify food choices and eating behavior to maintain or improve health.  Outcome: Completed/Met Date Met:  03/26/12  RD consulted for nutrition education regarding diabetes.     Lab Results  Component Value Date    HGBA1C 6.8* 03/25/2012    RD provided "Carbohydrate Counting for People with Diabetes" handout from the Academy of [employer] Dietetics.  Reviewed guidelines and recommendations.  Per diet recall, patient mostly drinks water.  RD emphasized carbohydrate portion control at meals, diet/sugar-free beverage intake and limitation of sweets.  Encouraged exercise as medically appropriate.  Expect fair compliance.  Body mass index is 26.26 kg/(m^2). Pt meets criteria for Overweight based on current BMI.  Current diet order is Carbohydrate Modified Medium Calorie, patient is consuming approximately 100% of meals at this time. Labs and medications reviewed.  No further nutrition interventions warranted at this time.  Please re-consult RD as needed.  Kirkland Hun, RD, LDN Pager #: [phone] After-Hours Pager #: [phone]

## 2012-03-26 NOTE — Progress Notes (Signed)
ANTICOAGULATION CONSULT NOTE - Initial Consult  Pharmacy Consult for Heparin Indication: chest pain/ACS  Allergies  Allergen Reactions  . Atorvastatin     myalgia  . Codeine   . Oxycodone   . Propoxyphene Hcl     Patient Measurements: Height: 5\' 10"  (177.8 cm) Weight: 182 lb 15.7 oz (83 kg) IBW/kg (Calculated) : 73 From 03/13/12: Wt = 186.6 lbs, Ht 5"10" Heparin Dosing Weight: 85 kg  Vital Signs: Temp: 97.7 F (36.5 C) (12/19 0515) Temp src: Oral (12/19 0515) BP: 127/68 mmHg (12/19 0515) Pulse Rate: 73  (12/19 0515)  Labs:  Basename 03/26/12 1230 03/26/12 0827 03/26/12 0237 03/25/12 2027 03/25/12 1333  HGB -- -- 10.6* -- 11.1*  HCT -- -- 30.6* -- 32.6*  PLT -- -- 181 -- 199  APTT -- -- -- -- --  LABPROT -- 13.4 -- -- --  INR -- 1.03 -- -- --  HEPARINUNFRC 0.46 -- 0.28* -- --  CREATININE -- -- 2.21* -- 2.26*  CKTOTAL -- -- -- -- --  CKMB -- -- -- -- --  TROPONINI -- 1.37* 1.03* 0.82* --    Estimated Creatinine Clearance: 26.6 ml/min (by C-G formula based on Cr of 2.21).   Medical History: Past Medical History  Diagnosis Date  . Hypertension   . Hypercholesterolemia   . Coronary artery disease   . Carotid stenosis   . AAA (abdominal aortic aneurysm)   . Anginal pain   . Myocardial infarction 1992  . Bronchitis     "3 or 4 times in my life; last time was 1990's" (03/25/2012)  . Borderline diabetes     "not taking any RX; CBG in am 136-140's" (03/25/2012)  . Ulcerative colitis     "severe; that's what prompted the ileostomy, etc" (03/25/2012)  . Headache     "used to have them alot before first heart OR; none since" (03/25/2012)  . Arthritis     "nonconfirmed in my right shoulder" (03/25/2012)  . Nephrolithiasis     hx of  . Renal insufficiency     "[facility] dr tells me my kidneys are working 30%" (03/25/2012)  . Prostate cancer 1996    "seed implants" (03/25/2012)  . Skin cancer     "several frozen off head" (03/25/2012)    Assessment: 78 yo male  with extensive cardiac history admitted with chest pain, trop has been elevated, EKG shows mildly prominent lateral ST changes. Pt. Is on IV heparin, Heparin level (0.46) is therapeutic on 1400 units/hr. CBC stable, no bleeding noted. Cath has been canceled for today, probably will schedule to tomorrow.   Goal of Therapy:  Heparin level 0.3-0.7 units/ml Monitor platelets by anticoagulation protocol: Yes   Plan:  1. Continue heparin infusion at current rate. 2. Daily heparin level and CBC. 3. F/U plans for cath.  Bayard Hugger, PharmD, BCPS  Clinical Pharmacist  Pager: [phone]  03/26/2012,1:38 PM

## 2012-03-26 NOTE — Progress Notes (Signed)
*  PRELIMINARY RESULTS* Echocardiogram 2D Echocardiogram has been performed.  Jeryl Columbia 03/26/2012, 4:43 PM

## 2012-03-26 NOTE — Progress Notes (Signed)
Inpatient Diabetes Program Recommendations  AACE/ADA: New Consensus Statement on Inpatient Glycemic Control (2013)  Target Ranges:  Prepandial:   less than 140 mg/dL      Peak postprandial:   less than 180 mg/dL (1-2 hours)      Critically ill patients:  140 - 180 mg/dL   Reason for Visit: Results for Mitchell Sloan, Mitchell Sloan (MRN [id number]) as of 03/26/2012 16:41  Ref. Range 03/25/2012 20:26  Hemoglobin A1C Latest Range: <5.7 % 6.8 (H)   Patient states he has known for years that he has "borderline" diabetes.  He has a meter that he checks CBG's with weekly.  He seems to be very knowledgeable regarding diet.  He appears to be doing okay without any medications at this time.  Will need to follow-up with PCP in [place]. [place].  No further recommendations at this time.

## 2012-03-27 ENCOUNTER — Encounter (HOSPITAL_COMMUNITY)
Admission: EM | Disposition: A | Discharge status: Home / Self Care | Payer: Self-pay | Source: Home / Self Care | Attending: Cardiology

## 2012-03-27 DIAGNOSIS — I25810 Atherosclerosis of coronary artery bypass graft(s) without angina pectoris: Secondary | ICD-10-CM

## 2012-03-27 DIAGNOSIS — I214 Non-ST elevation (NSTEMI) myocardial infarction: Secondary | ICD-10-CM

## 2012-03-27 HISTORY — PX: LEFT HEART CATHETERIZATION WITH CORONARY/GRAFT ANGIOGRAM: SHX5450

## 2012-03-27 HISTORY — PX: PERCUTANEOUS CORONARY STENT INTERVENTION (PCI-S): SHX5485

## 2012-03-27 LAB — GLUCOSE, CAPILLARY
Glucose-Capillary: 109 mg/dL — ABNORMAL HIGH (ref 70–99)
Glucose-Capillary: 118 mg/dL — ABNORMAL HIGH (ref 70–99)
Glucose-Capillary: 128 mg/dL — ABNORMAL HIGH (ref 70–99)

## 2012-03-27 LAB — CBC
Hemoglobin: 10.4 g/dL — ABNORMAL LOW (ref 13.0–17.0)
MCH: 33.3 pg (ref 26.0–34.0)
MCV: 94.9 fL (ref 78.0–100.0)
Platelets: 165 10*3/uL (ref 150–400)
RBC: 3.12 MIL/uL — ABNORMAL LOW (ref 4.22–5.81)
WBC: 7.2 10*3/uL (ref 4.0–10.5)

## 2012-03-27 LAB — BASIC METABOLIC PANEL
CO2: 21 mEq/L (ref 19–32)
Calcium: 8.5 mg/dL (ref 8.4–10.5)
Chloride: 105 mEq/L (ref 96–112)
Creatinine, Ser: 2.1 mg/dL — ABNORMAL HIGH (ref 0.50–1.35)
Glucose, Bld: 116 mg/dL — ABNORMAL HIGH (ref 70–99)

## 2012-03-27 LAB — POCT ACTIVATED CLOTTING TIME
Activated Clotting Time: 508 seconds
Activated Clotting Time: 181 seconds
Activated Clotting Time: 154 seconds

## 2012-03-27 LAB — HEPARIN LEVEL (UNFRACTIONATED): Heparin Unfractionated: 0.63 IU/mL (ref 0.30–0.70)

## 2012-03-27 SURGERY — LEFT HEART CATHETERIZATION WITH CORONARY/GRAFT ANGIOGRAM
Anesthesia: LOCAL

## 2012-03-27 MED ORDER — HEPARIN (PORCINE) IN NACL 2-0.9 UNIT/ML-% IJ SOLN
INTRAMUSCULAR | Status: AC
  Filled 2012-03-27: qty 1500

## 2012-03-27 MED ORDER — SODIUM CHLORIDE 0.9 % IJ SOLN
3.0000 mL | Freq: Two times a day (BID) | INTRAMUSCULAR | Status: DC
Start: 2012-03-27 — End: 2012-03-27

## 2012-03-27 MED ORDER — SODIUM CHLORIDE 0.9 % IV SOLN: 1.0000 mL/kg/h | INTRAVENOUS | Status: DC

## 2012-03-27 MED ORDER — MIDAZOLAM HCL 2 MG/2ML IJ SOLN
INTRAMUSCULAR | Status: AC
  Filled 2012-03-27: qty 2

## 2012-03-27 MED ORDER — LIDOCAINE HCL (PF) 1 % IJ SOLN
INTRAMUSCULAR | Status: AC
  Filled 2012-03-27: qty 30

## 2012-03-27 MED ORDER — SODIUM CHLORIDE 0.9 % IV SOLN: 250.0000 mL | INTRAVENOUS | Status: DC | PRN

## 2012-03-27 MED ORDER — FENTANYL CITRATE 0.05 MG/ML IJ SOLN
INTRAMUSCULAR | Status: AC
  Filled 2012-03-27: qty 2

## 2012-03-27 MED ORDER — NITROGLYCERIN 0.2 MG/ML ON CALL CATH LAB
INTRAVENOUS | Status: AC
  Filled 2012-03-27: qty 1

## 2012-03-27 MED ORDER — SODIUM CHLORIDE 0.9 % IV SOLN
INTRAVENOUS | Status: AC
  Administered 2012-03-27: 19:00:00 via INTRAVENOUS

## 2012-03-27 MED ORDER — SODIUM CHLORIDE 0.9 % IJ SOLN: 3.0000 mL | INTRAMUSCULAR | Status: DC | PRN

## 2012-03-27 MED ORDER — POTASSIUM CHLORIDE CRYS ER 10 MEQ PO TBCR
10.0000 meq | EXTENDED_RELEASE_TABLET | Freq: Every day | ORAL | Status: DC
  Administered 2012-03-27: 10 meq via ORAL
  Filled 2012-03-27 (×2): qty 1

## 2012-03-27 MED ORDER — BIVALIRUDIN 250 MG IV SOLR
INTRAVENOUS | Status: AC
  Filled 2012-03-27: qty 250

## 2012-03-27 MED ORDER — FENTANYL CITRATE 0.05 MG/ML IJ SOLN
50.0000 ug | Freq: Once | INTRAMUSCULAR | Status: AC
  Administered 2012-03-27: 50 ug via INTRAVENOUS
  Filled 2012-03-27: qty 2

## 2012-03-27 NOTE — Interval H&P Note (Signed)
History and Physical Interval Note:  03/27/2012 2:55 PM  Mitchell Sloan  has presented today for surgery, with the diagnosis of Chest pain  The various methods of treatment have been discussed with the patient and family. After consideration of risks, benefits and other options for treatment, the patient has consented to  Procedure(s) (LRB) with comments: LEFT HEART CATHETERIZATION WITH CORONARY/GRAFT ANGIOGRAM (N/A) as a surgical intervention .  The patient's history has been reviewed, patient examined, no change in status, stable for surgery.  I have reviewed the patient's chart and labs.  Questions were answered to the patient's satisfaction.    I have discussed the case with Dr. Riley Kill.  I have personally reviewed the previous cath films.  We will not inject the RCA, SVG to RCA because they are known to be occluded.  He has moderate - severe CKD .  Will need to minimize contrast use.   Elyn Aquas.

## 2012-03-27 NOTE — Progress Notes (Signed)
ANTICOAGULATION CONSULT NOTE -Follow up Pharmacy Consult for Heparin Indication: chest pain/ACS  Allergies  Allergen Reactions  . Atorvastatin     myalgia  . Codeine   . Oxycodone   . Propoxyphene Hcl     Patient Measurements: Height: 5\' 10"  (177.8 cm) Weight: 185 lb (83.915 kg) IBW/kg (Calculated) : 73 From 03/13/12: Wt = 186.6 lbs, Ht 5"10" Heparin Dosing Weight: 85 kg  Vital Signs: Temp: 97.6 F (36.4 C) (12/20 0500) Temp src: Oral (12/20 0500) BP: 143/61 mmHg (12/20 0500) Pulse Rate: 71  (12/20 0500)  Labs:  Basename 03/27/12 0555 03/26/12 1230 03/26/12 0827 03/26/12 0237 03/25/12 2027 03/25/12 1333  HGB 10.4* -- -- 10.6* -- --  HCT 29.6* -- -- 30.6* -- 32.6*  PLT 165 -- -- 181 -- 199  APTT -- -- -- -- -- --  LABPROT -- -- 13.4 -- -- --  INR -- -- 1.03 -- -- --  HEPARINUNFRC 0.63 0.46 -- 0.28* -- --  CREATININE 2.10* -- -- 2.21* -- 2.26*  CKTOTAL -- -- -- -- -- --  CKMB -- -- -- -- -- --  TROPONINI -- -- 1.37* 1.03* 0.82* --    Estimated Creatinine Clearance: 28 ml/min (by C-G formula based on Cr of 2.1).   Medical History: Past Medical History  Diagnosis Date  . Hypertension   . Hypercholesterolemia   . Coronary artery disease   . Carotid stenosis   . AAA (abdominal aortic aneurysm)   . Anginal pain   . Myocardial infarction 1992  . Bronchitis     "3 or 4 times in my life; last time was 1990's" (03/25/2012)  . Borderline diabetes     "not taking any RX; CBG in am 136-140's" (03/25/2012)  . Ulcerative colitis     "severe; that's what prompted the ileostomy, etc" (03/25/2012)  . Headache     "used to have them alot before first heart OR; none since" (03/25/2012)  . Arthritis     "nonconfirmed in my right shoulder" (03/25/2012)  . Nephrolithiasis     hx of  . Renal insufficiency     "[facility] dr tells me my kidneys are working 30%" (03/25/2012)  . Prostate cancer 1996    "seed implants" (03/25/2012)  . Skin cancer     "several frozen off head"  (03/25/2012)    Assessment: Heparin level = 0.63 on 1400 units/hr in this 78 yo male with extensive cardiac history admitted with chest pain. Non-STEMI with recurrent SSCP at rest without obvious dynamic ST/T changes but pos enzymes, normal LV by echo per cardiologist's assessment. H/o prior CABG x2 with known graft occlusion of the RCA graft, patent LIMA and distal occlusion of the OM graft. Plan is for cath later today.  CKD stage 4, CrCl ~ 28 ml/min.  CBC stable Hgb 10.4, pltc 165K;  no bleeding noted.  Goal of Therapy:  Heparin level 0.3-0.7 units/ml Monitor platelets by anticoagulation protocol: Yes   Plan:  1. Continue heparin infusion at current rate 1400 units/hr. 2. Daily heparin level and CBC. 3. F/U after cardiac cath.  Noah Delaine, RPh Clinical Pharmacist Pager: [phone] 03/27/2012,9:59 AM

## 2012-03-27 NOTE — Progress Notes (Signed)
Pt called and states he has been "having angina for last 2 hours" . He states is goes away when he sits up/stands up then comes back when he lies back down.  He states he didn't call because he thought it would go away.  BP 143/61 HR 71 NSR.  O2 applied at 2L/[place] EKG obtained and pt given 1 SL NTG with complete relief. EKG with out acute changes noted.  Will continue to monitor. Dierdre Highman

## 2012-03-27 NOTE — H&P (View-Only) (Signed)
 Patient Name: Mitchell Sloan Date of Encounter: 03/27/2012     Principal Problem:  *ACS (acute coronary syndrome) Active Problems:  HYPERCHOLESTEROLEMIA  HYPERTENSION  CAD, ARTERY BYPASS GRAFT  CAROTID STENOSIS  ABDOMINAL AORTIC ANEURYSM  CKD (chronic kidney disease) stage 4, GFR 15-29 ml/min  GERD (gastroesophageal reflux disease)    SUBJECTIVE    CURRENT MEDS    . amLODipine  10 mg Oral Daily  . aspirin EC  81 mg Oral Daily  . calcitRIOL  0.25 mcg Oral Custom  . carvedilol  25 mg Oral BID WC  . clopidogrel  75 mg Oral Q breakfast  . darifenacin  7.5 mg Oral Daily  . diazepam  5 mg Oral On Call  . folic acid  1 mg Oral Daily  . insulin aspart  0-9 Units Subcutaneous TID WC  . isosorbide mononitrate  30 mg Oral Daily  . pantoprazole  40 mg Oral Daily  . potassium citrate  10 mEq Oral BID  . rosuvastatin  10 mg Oral q1800  . sodium chloride  3 mL Intravenous Q12H  . sodium chloride  3 mL Intravenous Q12H  . sodium chloride  3 mL Intravenous Q12H  . Tamsulosin HCl  0.4 mg Oral Daily    OBJECTIVE  Filed Vitals:   03/26/12 0515 03/26/12 1340 03/26/12 2100 03/27/12 0500  BP: 127/68 118/56 135/58 143/61  Pulse: 73 71 71 71  Temp: 97.7 F (36.5 C) 97.2 F (36.2 C) 98 F (36.7 C) 97.6 F (36.4 C)  TempSrc: Oral Oral Oral Oral  Resp: 18 16 18 18  Height:      Weight: 182 lb 15.7 oz (83 kg)     SpO2: 97% 98% 98% 99%    Intake/Output Summary (Last 24 hours) at 03/27/12 0655 Last data filed at 03/27/12 0400  Gross per 24 hour  Intake   2627 ml  Output      0 ml  Net   2627 ml   Filed Weights   03/25/12 1907 03/26/12 0515  Weight: 184 lb 1.4 oz (83.5 kg) 182 lb 15.7 oz (83 kg)    PHYSICAL EXAM  General: Pleasant, NAD. Neuro: Alert and oriented X 3. Moves all extremities spontaneously. Psych: Normal affect. HEENT:  Normal  Neck: Supple without bruits or JVD. Lungs:  Resp regular and unlabored, CTA. Heart: RRR no s3, s4, or murmurs. Extremities:  No clubbing, cyanosis or edema. DP/PT/Radials 2+ and equal bilaterally.  Accessory Clinical Findings  CBC  Basename 03/27/12 0555 03/26/12 0237 03/25/12 1333  WBC 7.2 6.1 --  NEUTROABS -- -- 5.3  HGB 10.4* 10.6* --  HCT 29.6* 30.6* --  MCV 94.9 95.9 --  PLT 165 181 --   Basic Metabolic Panel  Basename 03/26/12 0237 03/25/12 1333  NA 133* 133*  K 3.9 4.4  CL 101 99  CO2 22 20  GLUCOSE 122* 195*  BUN 32* 35*  CREATININE 2.21* 2.26*  CALCIUM 8.7 9.1  MG -- --  PHOS -- --   Liver Function Tests  Basename 03/25/12 1333  AST 22  ALT 18  ALKPHOS 57  BILITOT 0.3  PROT 7.8  ALBUMIN 3.8   No results found for this basename: LIPASE:2,AMYLASE:2 in the last 72 hours Cardiac Enzymes  Basename 03/26/12 0827 03/26/12 0237 03/25/12 2027  CKTOTAL -- -- --  CKMB -- -- --  CKMBINDEX -- -- --  TROPONINI 1.37* 1.03* 0.82*   BNP No components found with this basename: POCBNP:3 D-Dimer No results   found for this basename: DDIMER:2 in the last 72 hours Hemoglobin A1C  Basename 03/25/12 2026  HGBA1C 6.8*   Fasting Lipid Panel  Basename 03/26/12 0237  CHOL 186  HDL 29*  LDLCALC 94  TRIG 315*  CHOLHDL 6.4  LDLDIRECT --   Thyroid Function Tests  Basename 03/25/12 2026  TSH 2.527  T4TOTAL --  T3FREE --  THYROIDAB --    TELE  No rhythm abnormalities  ECG  NSR.  Non specific T flattening.    ECHO  Study Conclusions  - Left ventricle: The cavity size was normal. Wall thickness was normal. Systolic function was normal. The estimated ejection fraction was in the range of 60% to 65%. Features are consistent with a pseudonormal left ventricular filling pattern, with concomitant abnormal relaxation and increased filling pressure (grade 2 diastolic dysfunction). - Mitral valve: Mild regurgitation. - Left atrium: The atrium was mildly dilated. - Pulmonary arteries: PA peak pressure: 38mm Hg (S). - Pericardium, extracardiac: A trivial pericardial effusion was  identified.  CATH 2011  Films reviewed in detail.  SVG to PDA occluded, RCA occluded.  LAD with ostial disease then occluded.  Septals collateralize distal RCA which fills retrograde.  SVG to OM distal limb which was stented in 05 occluded, proximal limb with more irregularity.  f    Radiology/Studies  Dg Chest 2 View  03/25/2012  *RADIOLOGY REPORT*  Clinical Data: Chest pain.  CHEST - 2 VIEW  Comparison: January 18, 2004.  Findings: Sternotomy wires are noted.  Cardiomediastinal silhouette appears normal.  Mild scarring is again noted in the lingular region.  No acute pulmonary disease is noted.  IMPRESSION: No acute cardiopulmonary abnormality seen.   Original Report Authenticated By: James Green Jr.,  M.D.     ASSESSMENT AND PLAN  1.  Non-STEMI with recurrent SSCP at rest without obvious dynamic ST/T changes but pos enzymes, normal LV by echo 2.  CKD, stage 4 3.  Prior CABG times two with known graft occlusion of the RCA graft, patent LIMA and distal occlusion of the OM graft.    Based on the above suspect that he will require cath study to assess.  Case is for later today.  This is patients 64 anniversary today.  I have explained the options in detail and the risks involved.  They are well apprised to the possibilities.  Limited contrast would be advised.  Views of the RCA and RCA graft will not be required.  A LCA, LIMA, and SVG to the OM will provide the need information.    Signed, Lourene Hoston MD, FACC, FSCAI  

## 2012-03-27 NOTE — Progress Notes (Signed)
Site area: right groin  Site Prior to Removal:  Level 0  Pressure Applied For 20 MINUTES    Minutes Beginning at 2125  Manual:   yes  Patient Status During Pull:  stable  Post Pull Groin Site:  Level 0  Post Pull Instructions Given:  yes  Post Pull Pulses Present:  yes  Dressing Applied:  yes  Comments:

## 2012-03-27 NOTE — Progress Notes (Signed)
Patient Name: Mitchell Sloan Date of Encounter: 03/27/2012     Principal Problem:  *ACS (acute coronary syndrome) Active Problems:  HYPERCHOLESTEROLEMIA  HYPERTENSION  CAD, ARTERY BYPASS GRAFT  CAROTID STENOSIS  ABDOMINAL AORTIC ANEURYSM  CKD (chronic kidney disease) stage 4, GFR 15-29 ml/min  GERD (gastroesophageal reflux disease)    SUBJECTIVE    CURRENT MEDS    . amLODipine  10 mg Oral Daily  . aspirin EC  81 mg Oral Daily  . calcitRIOL  0.25 mcg Oral Custom  . carvedilol  25 mg Oral BID WC  . clopidogrel  75 mg Oral Q breakfast  . darifenacin  7.5 mg Oral Daily  . diazepam  5 mg Oral On Call  . folic acid  1 mg Oral Daily  . insulin aspart  0-9 Units Subcutaneous TID WC  . isosorbide mononitrate  30 mg Oral Daily  . pantoprazole  40 mg Oral Daily  . potassium citrate  10 mEq Oral BID  . rosuvastatin  10 mg Oral q1800  . sodium chloride  3 mL Intravenous Q12H  . sodium chloride  3 mL Intravenous Q12H  . sodium chloride  3 mL Intravenous Q12H  . Tamsulosin HCl  0.4 mg Oral Daily    OBJECTIVE  Filed Vitals:   03/26/12 0515 03/26/12 1340 03/26/12 2100 03/27/12 0500  BP: 127/68 118/56 135/58 143/61  Pulse: 73 71 71 71  Temp: 97.7 F (36.5 C) 97.2 F (36.2 C) 98 F (36.7 C) 97.6 F (36.4 C)  TempSrc: Oral Oral Oral Oral  Resp: 18 16 18 18   Height:      Weight: 182 lb 15.7 oz (83 kg)     SpO2: 97% 98% 98% 99%    Intake/Output Summary (Last 24 hours) at 03/27/12 0655 Last data filed at 03/27/12 0400  Gross per 24 hour  Intake   2627 ml  Output      0 ml  Net   2627 ml   Filed Weights   03/25/12 1907 03/26/12 0515  Weight: 184 lb 1.4 oz (83.5 kg) 182 lb 15.7 oz (83 kg)    PHYSICAL EXAM  General: Pleasant, NAD. Neuro: Alert and oriented X 3. Moves all extremities spontaneously. Psych: Normal affect. HEENT:  Normal  Neck: Supple without bruits or JVD. Lungs:  Resp regular and unlabored, CTA. Heart: RRR no s3, s4, or murmurs. Extremities:  No clubbing, cyanosis or edema. DP/PT/Radials 2+ and equal bilaterally.  Accessory Clinical Findings  CBC  Basename 03/27/12 0555 03/26/12 0237 03/25/12 1333  WBC 7.2 6.1 --  NEUTROABS -- -- 5.3  HGB 10.4* 10.6* --  HCT 29.6* 30.6* --  MCV 94.9 95.9 --  PLT 165 181 --   Basic Metabolic Panel  Basename 03/26/12 0237 03/25/12 1333  NA 133* 133*  K 3.9 4.4  CL 101 99  CO2 22 20  GLUCOSE 122* 195*  BUN 32* 35*  CREATININE 2.21* 2.26*  CALCIUM 8.7 9.1  MG -- --  PHOS -- --   Liver Function Tests  Basename 03/25/12 1333  AST 22  ALT 18  ALKPHOS 57  BILITOT 0.3  PROT 7.8  ALBUMIN 3.8   No results found for this basename: LIPASE:2,AMYLASE:2 in the last 72 hours Cardiac Enzymes  Basename 03/26/12 0827 03/26/12 0237 03/25/12 2027  CKTOTAL -- -- --  CKMB -- -- --  CKMBINDEX -- -- --  TROPONINI 1.37* 1.03* 0.82*   BNP No components found with this basename: POCBNP:3 D-Dimer No results  found for this basename: DDIMER:2 in the last 72 hours Hemoglobin A1C  Basename 03/25/12 2026  HGBA1C 6.8*   Fasting Lipid Panel  Basename 03/26/12 0237  CHOL 186  HDL 29*  LDLCALC 94  TRIG 161*  CHOLHDL 6.4  LDLDIRECT --   Thyroid Function Tests  Basename 03/25/12 2026  TSH 2.527  T4TOTAL --  T3FREE --  THYROIDAB --    TELE  No rhythm abnormalities  ECG  NSR.  Non specific T flattening.    ECHO  Study Conclusions  - Left ventricle: The cavity size was normal. Wall thickness was normal. Systolic function was normal. The estimated ejection fraction was in the range of 60% to 65%. Features are consistent with a pseudonormal left ventricular filling pattern, with concomitant abnormal relaxation and increased filling pressure (grade 2 diastolic dysfunction). - Mitral valve: Mild regurgitation. - Left atrium: The atrium was mildly dilated. - Pulmonary arteries: PA peak pressure: 38mm Hg (S). - Pericardium, extracardiac: A trivial pericardial effusion was  identified.  CATH 2011  Films reviewed in detail.  SVG to PDA occluded, RCA occluded.  LAD with ostial disease then occluded.  Septals collateralize distal RCA which fills retrograde.  SVG to OM distal limb which was stented in 05 occluded, proximal limb with more irregularity.  f    Radiology/Studies  Dg Chest 2 View  03/25/2012  *RADIOLOGY REPORT*  Clinical Data: Chest pain.  CHEST - 2 VIEW  Comparison: January 18, 2004.  Findings: Sternotomy wires are noted.  Cardiomediastinal silhouette appears normal.  Mild scarring is again noted in the lingular region.  No acute pulmonary disease is noted.  IMPRESSION: No acute cardiopulmonary abnormality seen.   Original Report Authenticated By: Lupita Raider.,  M.D.     ASSESSMENT AND PLAN  1.  Non-STEMI with recurrent SSCP at rest without obvious dynamic ST/T changes but pos enzymes, normal LV by echo 2.  CKD, stage 4 3.  Prior CABG times two with known graft occlusion of the RCA graft, patent LIMA and distal occlusion of the OM graft.    Based on the above suspect that he will require cath study to assess.  Case is for later today.  This is patients 38 anniversary today.  I have explained the options in detail and the risks involved.  They are well apprised to the possibilities.  Limited contrast would be advised.  Views of the RCA and RCA graft will not be required.  A LCA, LIMA, and SVG to the OM will provide the need information.    Signed, Shawnie Pons MD, [facility], FSCAI

## 2012-03-27 NOTE — CV Procedure (Signed)
   Cardiac Catheterization Operative Report  HERSHELL BRANDL [id number] 12/20/20134:17 PM No primary provider on file.  Procedure Performed:  1. PTCA/DES x 1 proximal body of SVG to obtuse marginal 2. Distal protection of SVG with Spider distal protection device  Operator: Verne Carrow, MD  Indication: 78 yo male with history of CAD s/p CABG and redo, renal insufficiency admitted with NSTEMI. Diagnostic cath today per Dr. Elease Hashimoto. Pt found to have severe stenosis in proximal body of SVG felt to be the culprit vessel. I was asked to perform the PCI.                                        Procedure Details: A 5 French sheath was present in the right femoral artery. I upsized the sheath to a 6 [place] system. The pt has been maintained on Plavix and ASA at home. He was given a bolus of Angiomax and a drip was started. I then engaged the SVG with a LCB guiding catheter. When the ACT was greater than 200, I passed a BMW wire down the vein graft body. I then advanced a Spider distal protection device over the wire and deployed the filter in the distal body of the SVG. The BMW wire was removed. I then used a 2.5 x 12 mm balloon to pre-dilate the stenosis. A 4.0 x 12 mm Xience Expedition drug eluting stent was deployed in the proximal body of the SVG. The filter was retrieved. The flow was sluggish into the distal vessel but improved after two rounds of IC NTG and after removing the filter. The stenosis was taken from 99% to 0%. 70cc contrast used.  There were no immediate complications. The patient was taken to the recovery area in stable condition.   Impression: 1. NSTEMI secondary to severe stenosis proximal body of SVG to obtuse marginal 2. Successful PTCA/DES x 1 proximal body of SVG to obtuse marginal  Recommendations: Continue ASA and Plavix. Gentle hydration.        Complications:  None; patient tolerated the procedure well.

## 2012-03-27 NOTE — CV Procedure (Signed)
    Cardiac Cath Note  Mitchell Sloan [id number] 12/24/1927  Procedure: left  Heart Cardiac Catheterization  Indications: unstable angina  Procedure Details Consent: Obtained Time Out: Verified patient identification, verified procedure, site/side was marked, verified correct patient position, special equipment/implants available, Radiology Safety Procedures followed,  medications/allergies/relevent history reviewed, required imaging and test results available.  Performed   Medications: Fentanyl: 25 mcg IV   Versed: 1 mg IV  The right femoral artery was easily canulated using a modified Seldinger technique.  Hemodynamics:    LV pressure: not done Aortic pressure: 131/56  Angiography   Left Main: moderate sized , no critical disease  Left anterior Descending: occluded prox.  Several septals are visible.  No changes from previous cath  Left Circumflex: moderate sized vessel.    Right Coronary Artery: known to be occluded - not injected  SVG to RCA:  Known to be occluded, not injected  SVG to OM: large graft.  There is a 99% stenosis in the proximal graft  LIMA to LAD: not selectively engaged.  In attempting to cannulate the LIMA, we had some difficulty in engaging.  We chose to not attempt any further views of the LIMA in an effort to save contrast.  When we removed the catheter, we flushed lots of atheromatous debris from the catheter.   Complications: No apparent complications Patient did tolerate procedure well.  Contrast used: 10 cc  Conclusions:  Tight stenosis in the SVG to OM.  Discussed with Dr. Riley Kill and Dr. Sanjuana Kava.  We will proceed with PCI today.  Mitchell Sloan, Mitchell Sloan., MD, [facility] 03/27/2012, 3:39 PM Office - [phone] Pager [phone]

## 2012-03-28 LAB — BASIC METABOLIC PANEL
BUN: 21 mg/dL (ref 6–23)
Chloride: 101 mEq/L (ref 96–112)
GFR calc Af Amer: 35 mL/min — ABNORMAL LOW (ref >90)
GFR calc non Af Amer: 30 mL/min — ABNORMAL LOW (ref >90)
Potassium: 3.9 mEq/L (ref 3.5–5.1)
Sodium: 135 mEq/L (ref 135–145)

## 2012-03-28 LAB — CBC
HCT: 30.1 % — ABNORMAL LOW (ref 39.0–52.0)
MCH: 34.1 pg — ABNORMAL HIGH (ref 26.0–34.0)
MCHC: 35.5 g/dL (ref 30.0–36.0)
MCV: 95.9 fL (ref 78.0–100.0)
Platelets: 182 10*3/uL (ref 150–400)
RDW: 12.8 % (ref 11.5–15.5)
WBC: 8.3 10*3/uL (ref 4.0–10.5)

## 2012-03-28 MED ORDER — CLOPIDOGREL BISULFATE 75 MG PO TABS: 75.0000 mg | ORAL_TABLET | Freq: Every day | ORAL | Status: DC

## 2012-03-28 MED ORDER — CARVEDILOL 25 MG PO TABS: 25.0000 mg | ORAL_TABLET | Freq: Two times a day (BID) | ORAL | Status: DC

## 2012-03-28 MED ORDER — NITROGLYCERIN 0.4 MG SL SUBL: 0.4000 mg | SUBLINGUAL_TABLET | SUBLINGUAL | Status: DC | PRN

## 2012-03-28 NOTE — Progress Notes (Signed)
CARDIAC REHAB PHASE I   Patient being discharged at this time. Patient walked in hallway with floor RN. Discussed discharge education with patient and his wife. Patient and wife agree and voice understanding. Agrees to outpatient cardiac rehab. We will find the nearest hospital that offers program and send referral and call patient to let him know where we sent it.   [id number]  Mitchell Sloan, Mitchell Sloan

## 2012-03-28 NOTE — Discharge Summary (Signed)
Discharge Summary   Patient ID: Mitchell Sloan,  MRN: [id number], DOB/AGE: 1927-10-17 78 y.o.  Admit date: 03/25/2012 Discharge date: 03/28/2012  Primary Physician: No primary provider on file. Primary Cardiologist: Shawnie Pons, MD  Discharge Diagnoses Principal Problem:  *ACS (acute coronary syndrome) Active Problems:  HYPERCHOLESTEROLEMIA  HYPERTENSION  CAD, ARTERY BYPASS GRAFT  CAROTID STENOSIS  ABDOMINAL AORTIC ANEURYSM  CKD (chronic kidney disease) stage 4, GFR 15-29 ml/min  GERD (gastroesophageal reflux disease)   Allergies Allergies  Allergen Reactions  . Atorvastatin     myalgia  . Codeine   . Oxycodone   . Propoxyphene Hcl     Diagnostic Studies/Procedures  PA/LATERAL CHEST X-RAY - 03/25/12  CHEST - 2 VIEW  Comparison: January 18, 2004.  Findings: Sternotomy wires are noted. Cardiomediastinal silhouette  appears normal. Mild scarring is again noted in the lingular  region. No acute pulmonary disease is noted.  IMPRESSION:  No acute cardiopulmonary abnormality seen.   TRANSTHORACIC ECHOCARDIOGRAM - 03/26/12  Study Conclusions  - Left ventricle: The cavity size was normal. Wall thickness was normal. Systolic function was normal. The estimated ejection fraction was in the range of 60% to 65%. Features are consistent with a pseudonormal left ventricular filling pattern, with concomitant abnormal relaxation and increased filling pressure (grade 2 diastolic dysfunction).  - Mitral valve: Mild regurgitation. - Left atrium: The atrium was mildly dilated. - Pulmonary arteries: PA peak pressure: 38mm Hg (S). - Pericardium, extracardiac: A trivial pericardial effusion was identified.  CARDIAC CATHETERIZATION - ANGIOGRAPHY - 03/27/12  Hemodynamics:  LV pressure: not done  Aortic pressure: 131/56  Angiography  Left Main: moderate sized , no critical disease  Left anterior Descending: occluded prox. Several septals are visible. No changes from previous  cath  Left Circumflex: moderate sized vessel.  Right Coronary Artery: known to be occluded - not injected  SVG to RCA: Known to be occluded, not injected  SVG to OM: large graft. There is a 99% stenosis in the proximal graft  LIMA to LAD: not selectively engaged. In attempting to cannulate the LIMA, we had some difficulty in engaging. We chose to not attempt any further views of the LIMA in an effort to save contrast. When we removed the catheter, we flushed lots of atheromatous debris from the catheter.  Complications: No apparent complications  Patient did tolerate procedure well.   Conclusions: Tight stenosis in the SVG to OM. Discussed with Dr. Riley Kill and Dr. Sanjuana Kava. We will proceed with PCI today.  CARDIAC CATHETERIZATION - PCI - 03/27/12  Impression:  1. NSTEMI secondary to severe stenosis proximal body of SVG to obtuse marginal  2. Successful PTCA/DES x 1 proximal body of SVG to obtuse marginal  History of Present Illness  Mitchell Sloan is a 78yo male who was admitted to [facility] on 03/25/12 with the above problem list. He has a history of CAD (s/p CABG x 3 in 1992, multiple caths since), diastolic dysfunction, CKD (stage III-IV), AAA (4.4 x 3.9 cm, stable). He was in his USOH until 2-3 days leading up to admission when he noticed anginal pain on minimal exertion. He is typically able to walk 1/2 mile without incident, however could not ambulate 50 feet without experiencing dull, substernal chest burning radiation to his neck and left arm alleviated by rest and NTG. He presented to [facility] ED with these complaints. There, EKG revealed nonspecific change. Initial troponin was WNL. CXR as above revealed no acute process. Renal function was at baseline on BMET.  Given his significant cardiac history and symptoms concerning for unstable angina, he was admitted with plans for diagnostic cardiac catheterization.   Hospital Course   He was loaded with Plavix and Coreg was up-titrated. He  was started on heparin. He tolerated these medication adjustments well. He remained stable overnight, but ambulated in the halls the following morning with marked functional limitation due to SOB. He underwent TTE as above revealing LVEF 60%, grade 2 dd, mild LA dilatation, mild MR. The decision was made to continue hydrating. Cardiac caths in the past had been complicated by worsening renal function and limited the decision for ischemic evaluation. A1C returned abnormal at 6.8% and the diabetes coordinator was consulted who felt diet-control initially was appropriate. Lipid panel - LDL 94, HDL 29, TG 315, TC 186. TSH WNL. He did develop intermittent angina, and enzymes returned positive. His Cr improved, and he was informed, consented and prepped for cath as above. This revealed prox LAD occlusoin with multiple septals, RCA CTO, SVG-RCA CTO, 99% prox SVG-OM stenosis. LV gram deferred. LIMA-LAD was difficult to engage, and the interventionalist chose not to attempt any further views in an effort to save contrast. The decision was made to pursue PCI of the 99% prox SVG-OM lesion, which was successfully accomplished with a DES. He tolerated both procedures well, without complications. He will continue DAPT for at least a year if not indefinitely. The timing of this will be determined by Dr. Riley Kill on follow-up. He remained stable overnight without complaints. He ambulated well with cardiac rehab. Groin site with evidence of mild ecchymosis and induration, but no bleeding or bruit. He will be discharged home this morning with follow-up in </= 7 days given NSTEMI status. Medication adjustments have been outlined below. This information, including post-cath instructions, has been clearly outlined in the discharge AVS.   Discharge Vitals:  Blood pressure 141/54, pulse 77, temperature 98 F (36.7 C), temperature source Oral, resp. rate 14, height 5\' 10"  (1.778 m), weight 85 kg (187 lb 6.3 oz), SpO2 98.00%.    Labs: Recent Labs  [facility] 03/28/12 0820 03/27/12 0555   WBC 8.3 7.2   HGB 10.7* 10.4*   HCT 30.1* 29.6*   MCV 95.9 94.9   PLT 182 165    Lab 03/28/12 0500 03/27/12 0555 03/26/12 0237 03/25/12 1333  NA 135 137 133* --  K 3.9 4.1 3.9 --  CL 101 105 101 --  CO2 19 21 22  --  BUN 21 27* 32* --  CREATININE 1.96* 2.10* 2.21* --  CALCIUM 8.6 8.5 8.7 --  PROT -- -- -- 7.8  BILITOT -- -- -- 0.3  ALKPHOS -- -- -- 57  ALT -- -- -- 18  AST -- -- -- 22  AMYLASE -- -- -- --  LIPASE -- -- -- --  GLUCOSE 106* 116* 122* --   Recent Labs  Basename 03/25/12 2026   HGBA1C 6.8*   Recent Labs  Basename 03/26/12 0827 03/26/12 0237 03/25/12 2027   CKTOTAL -- -- --   CKMB -- -- --   CKMBINDEX -- -- --   TROPONINI 1.37* 1.03* 0.82*   Recent Labs  Basename 03/26/12 0237   CHOL 186   HDL 29*   LDLCALC 94   TRIG 315*   CHOLHDL 6.4   LDLDIRECT --    Basename 03/25/12 2026  TSH 2.527  T4TOTAL --  T3FREE --  THYROIDAB --    Disposition:  Discharge Orders    Future Appointments: Provider: Department: Dept  Phone: Center:   06/16/2012 12:00 PM Herby Abraham, MD [facility] Main Office [place]) [phone] [facility]   12/31/2012 8:30 AM [facility] [phone] [facility]   12/31/2012 9:00 AM Evern Bio, NP [facility] [phone] [facility]     Future Orders Please Complete By Expires   Diet - low sodium heart healthy      Increase activity slowly        Follow-up Information    Follow up with [facility]. (Will call with appointment date & time. )    Contact information:   [address] [place] [address]        Discharge Medications:    Medication List     As of 03/28/2012  9:18 AM    START taking these medications         clopidogrel 75 MG tablet   Commonly known as: PLAVIX   Take 1 tablet (75 mg total) by mouth daily with breakfast.      nitroGLYCERIN 0.4 MG  SL tablet   Commonly known as: NITROSTAT   Place 1 tablet (0.4 mg total) under the tongue every 5 (five) minutes x 3 doses as needed for chest pain.      CHANGE how you take these medications         carvedilol 25 MG tablet   Commonly known as: COREG   Take 1 tablet (25 mg total) by mouth 2 (two) times daily with a meal.   What changed: - medication strength - dose      CONTINUE taking these medications         amLODipine 10 MG tablet   Commonly known as: NORVASC      aspirin 81 MG tablet      calcitRIOL 0.25 MCG capsule   Commonly known as: ROCALTROL      folic acid 800 MCG tablet   Commonly known as: FOLVITE      isosorbide mononitrate 30 MG 24 hr tablet   Commonly known as: IMDUR      multivitamin with minerals Tabs      omeprazole 20 MG capsule   Commonly known as: PRILOSEC      potassium citrate 10 MEQ (1080 MG) SR tablet   Commonly known as: UROCIT-K      Red Yeast Rice 600 MG Caps      rosuvastatin 10 MG tablet   Commonly known as: CRESTOR      solifenacin 5 MG tablet   Commonly known as: VESICARE      Tamsulosin HCl 0.4 MG Caps   Commonly known as: FLOMAX          Where to get your medications    These are the prescriptions that you need to pick up.   You may get these medications from any pharmacy.         carvedilol 25 MG tablet   clopidogrel 75 MG tablet   nitroGLYCERIN 0.4 MG SL tablet            Outstanding Labs/Studies: none  Duration of Discharge Encounter: Greater than 30 minutes including physician time.  Signed, R. Hurman Horn, PA-C 03/28/2012, 9:18 AM

## 2012-03-28 NOTE — Progress Notes (Signed)
Patient Name: Mitchell Sloan Date of Encounter: 03/28/2012     Principal Problem:  *ACS (acute coronary syndrome) Active Problems:  HYPERCHOLESTEROLEMIA  HYPERTENSION  CAD, ARTERY BYPASS GRAFT  CAROTID STENOSIS  ABDOMINAL AORTIC ANEURYSM  CKD (chronic kidney disease) stage 4, GFR 15-29 ml/min  GERD (gastroesophageal reflux disease)    SUBJECTIVE: Feels well this AM. No chest pain, shortness of breath, lightheadedness or palpitations. Ready to go home.    OBJECTIVE  Filed Vitals:   03/27/12 1713 03/27/12 2047 03/28/12 0052 03/28/12 0516  BP: 127/63 134/62 122/48 145/61  Pulse: 73 73 73 76  Temp: 97.7 F (36.5 C)  97.6 F (36.4 C) 98.2 F (36.8 C)  TempSrc: Oral  Oral Oral  Resp: 18 14 18 16   Height:      Weight:   85 kg (187 lb 6.3 oz)   SpO2: 95% 96% 95% 94%    Intake/Output Summary (Last 24 hours) at 03/28/12 0735 Last data filed at 03/28/12 1610  Gross per 24 hour  Intake    800 ml  Output   1275 ml  Net   -475 ml   Weight change: 1.085 kg (2 lb 6.3 oz)  PHYSICAL EXAM  General: Well developed, well nourished, in no acute distress. Head: Normocephalic, atraumatic, sclera non-icteric, no xanthomas, nares are without discharge. Neck: Supple without bruits or JVD. Lungs:  esp regular and unlabored, CTA. Heart: RRR no s3, s4, or murmurs. Abdomen: Soft, non-tender, non-distended, BS + x 4.  Msk: Strength and tone appears normal for age. Extremities: Groin site with mild induration on palpation. No tenderness, discharge, ecchymosis or bruit. No clubbing, cyanosis or edema. DP/PT/Radials 2+ and equal bilaterally. Neuro: Alert and oriented X 3. Moves all extremities spontaneously. Psych: Normal affect.  LABS:  Recent Labs  [facility] 03/27/12 0555 03/26/12 0237   WBC 7.2 6.1   HGB 10.4* 10.6*   HCT 29.6* 30.6*   MCV 94.9 95.9   PLT 165 181   Lab 03/28/12 0500 03/27/12 0555 03/26/12 0237 03/25/12 1333  NA 135 137 133* --  K 3.9 4.1 3.9 --  CL 101 105  101 --  CO2 19 21 22  --  BUN 21 27* 32* --  CREATININE 1.96* 2.10* 2.21* --  CALCIUM 8.6 8.5 8.7 --  PROT -- -- -- 7.8  BILITOT -- -- -- 0.3  ALKPHOS -- -- -- 57  ALT -- -- -- 18  AST -- -- -- 22  AMYLASE -- -- -- --  LIPASE -- -- -- --  GLUCOSE 106* 116* 122* --   Recent Labs  Basename 03/25/12 2026   HGBA1C 6.8*   Recent Labs  Basename 03/26/12 0827 03/26/12 0237 03/25/12 2027   CKTOTAL -- -- --   CKMB -- -- --   CKMBINDEX -- -- --   TROPONINI 1.37* 1.03* 0.82*   Recent Labs  [facility] 03/26/12 0237   CHOL 186   HDL 29*   LDLCALC 94   TRIG 315*   CHOLHDL 6.4   LDLDIRECT --    Basename 03/25/12 2026  TSH 2.527  T4TOTAL --  T3FREE --  THYROIDAB --   TELE: NSR, 60-80 bpm, rare PVC  ECG: NSR, 72 bpm, baseline ST depressions V5, V6, TWI I and aVL unchanged   Radiology/Studies:  Dg Chest 2 View  03/25/2012  *RADIOLOGY REPORT*  Clinical Data: Chest pain.  CHEST - 2 VIEW  Comparison: January 18, 2004.  Findings: Sternotomy wires are noted.  Cardiomediastinal silhouette appears  normal.  Mild scarring is again noted in the lingular region.  No acute pulmonary disease is noted.  IMPRESSION: No acute cardiopulmonary abnormality seen.   Original Report Authenticated By: Lupita Raider.,  M.D.     Current Medications:     . amLODipine  10 mg Oral Daily  . aspirin EC  81 mg Oral Daily  . calcitRIOL  0.25 mcg Oral Custom  . carvedilol  25 mg Oral BID WC  . clopidogrel  75 mg Oral Q breakfast  . darifenacin  7.5 mg Oral Daily  . folic acid  1 mg Oral Daily  . insulin aspart  0-9 Units Subcutaneous TID WC  . isosorbide mononitrate  30 mg Oral Daily  . pantoprazole  40 mg Oral Daily  . potassium chloride  10 mEq Oral Daily  . rosuvastatin  10 mg Oral q1800  . Tamsulosin HCl  0.4 mg Oral Daily    ASSESSMENT AND PLAN:  1. NSTEMI- s/p cath yesterday. PTCA/DES placed to 99% prox SVG-OM stenosis. Continue DAPT- ASA/Plavix. Discuss with MD length of therapy. Continue  BB, statin, Imdur and Norvasc. Groin site mildly indurated, but without hematoma or bruit. Ambulate with cardiac rehab this AM. If tolerates well, discharge home. Follow-up in </= 7 days given NSTEMI status.   2. CKD, stage 4- Cr actually continues to downtrend since admission and post-cath. 1.96 today. Continue IVF until 9 AM as ordered.   3. Normocytic anemia- likely secondary to underlying CKD. No evidence of bleeding.   4. Type 2 DM- newly diagnosed, A1C 6.8%. Diabetes coordinator consulted. Felt to be well-controlled with diet alone. Recommended follow-up with PCP.   5. HTN- continue current antihypertensives. BP well-controlled based on new JNC VIII guidelines.   6. HLD- continue statin.  7. AAA- 3.9 x 4.4 cm on 12/2011. Followed by vascular. Stable.   8. GERD- continue PPI.     Signed, R. Hurman Horn, PA-C 03/28/2012, 7:35 AM  Patient examined known well from admission.  Right groin with mild echymosis.  Hydration done at 9:00 Cr at baseline around 1.96.  F/U Dr Vickey Huger 8:37 AM 03/28/2012

## 2012-03-30 ENCOUNTER — Telehealth: Payer: Self-pay | Admitting: Cardiology

## 2012-03-30 MED FILL — Dextrose Inj 5%: INTRAVENOUS | Qty: 50 | Status: AC

## 2012-03-30 NOTE — Telephone Encounter (Signed)
I spoke with the pt and made him aware of post-hospital appointment.  I reviewed the pt's discharge medications by phone and he did pick-up prescriptions from pharmacy.  The pt did not realize that he needed to increase Carvedilol.  I reviewed these instructions with the pt and he will finish his current supply 12.5mg  two by mouth twice a day and then get new Rx filled.

## 2012-03-30 NOTE — Telephone Encounter (Signed)
eph per night messages/pt is NSTEMI s/p cath 7-10 day transitional care

## 2012-04-13 ENCOUNTER — Ambulatory Visit (INDEPENDENT_AMBULATORY_CARE_PROVIDER_SITE_OTHER): Payer: Medicare Other | Admitting: Physician Assistant

## 2012-04-13 ENCOUNTER — Telehealth: Payer: Self-pay | Admitting: *Deleted

## 2012-04-13 ENCOUNTER — Encounter: Payer: Self-pay | Admitting: Physician Assistant

## 2012-04-13 VITALS — BP 138/62 | HR 70 | Ht 70.0 in | Wt 185.0 lb

## 2012-04-13 DIAGNOSIS — I209 Angina pectoris, unspecified: Secondary | ICD-10-CM

## 2012-04-13 DIAGNOSIS — E7800 Pure hypercholesterolemia, unspecified: Secondary | ICD-10-CM

## 2012-04-13 DIAGNOSIS — I25810 Atherosclerosis of coronary artery bypass graft(s) without angina pectoris: Secondary | ICD-10-CM

## 2012-04-13 DIAGNOSIS — I10 Essential (primary) hypertension: Secondary | ICD-10-CM

## 2012-04-13 DIAGNOSIS — I208 Other forms of angina pectoris: Secondary | ICD-10-CM

## 2012-04-13 DIAGNOSIS — I2510 Atherosclerotic heart disease of native coronary artery without angina pectoris: Secondary | ICD-10-CM

## 2012-04-13 LAB — BASIC METABOLIC PANEL
BUN: 39 mg/dL — ABNORMAL HIGH (ref 6–23)
Chloride: 105 mEq/L (ref 96–112)
Creatinine, Ser: 2.5 mg/dL — ABNORMAL HIGH (ref 0.4–1.5)
Glucose, Bld: 126 mg/dL — ABNORMAL HIGH (ref 70–99)
Potassium: 4.3 mEq/L (ref 3.5–5.1)

## 2012-04-13 MED ORDER — CLOPIDOGREL BISULFATE 75 MG PO TABS: 75.0000 mg | ORAL_TABLET | Freq: Every day | ORAL | Status: DC

## 2012-04-13 MED ORDER — CARVEDILOL 25 MG PO TABS: 25.0000 mg | ORAL_TABLET | Freq: Two times a day (BID) | ORAL | Status: DC

## 2012-04-13 MED ORDER — PANTOPRAZOLE SODIUM 40 MG PO TBEC: 40.0000 mg | DELAYED_RELEASE_TABLET | Freq: Every day | ORAL | Status: DC

## 2012-04-13 MED ORDER — ISOSORBIDE MONONITRATE ER 60 MG PO TB24: 60.0000 mg | ORAL_TABLET | Freq: Every day | ORAL | Status: DC

## 2012-04-13 NOTE — Patient Instructions (Addendum)
INCREASE IMDUR TO 60 MG DAILY  STOP OMEPRAZOLE   START PROTONIX 40 MG DAILY  LAB TODAY; BMET  FOLLOW UP WITH DR. Riley Kill AS ALREADY SCHEDULED  YOU HAVE BEEN GIVEN REFILLS FOR IMDUR, PROTONIX, COREG, [facility]  I WILL CALL [facility] TODAY FOR YOUR PROTONIX

## 2012-04-13 NOTE — Progress Notes (Signed)
[address] [place], [place]  [address] Phone: [phone], Fax:  [phone]  Date:  04/13/2012   Name:  Mitchell Sloan   DOB:  1928/02/24   MRN:  [id number]  PCP:  No primary provider on file.  Primary Cardiologist:  Dr.  Shawnie Pons  Primary Electrophysiologist:  None    History of Present Illness: Mitchell Sloan is a 79 y.o. male who returns for follow up after a recent admission to the hospital for NSTEMI.  He has a hx of CAD (s/p CABG x 3 in 1992, multiple caths since), CKD (stage III, baseline Cr 2.2-2.3), AAA (4.4 x 3.9 cm, followed by [facility]), carotid stenosis, HTN and HL.  Last seen by Dr.  Shawnie Pons 03/13/12.  He was stable at that time.    He was admitted 12/18-12/21. He presented to the hospital with progressively worsening chest discomfort with minimal activity. Cardiac markers were abnormal ruling him in for a non-STEMI. Given his chronic kidney disease, he was hydrated. He ultimately underwent cardiac catheterization. LHC 03/27/12: pLAD occluded, RCA known to be occluded, SVG-RCA known to be occluded, pSVG-OM 99%, LIMA-LAD not selectively engaged. PCI: Xience Xpedition DES to the proximal SVG-OM. Echo 03/26/12: EF 60-65%, grade 2 diastolic dysfunction, mild MR, mild LAE, PASP 38, trivial effusion.  Creatinine remained stable. Patient was noted to have an elevated A1c (6.8) diagnosing him with diabetes and he was seen by the diabetic coordinator.  Since discharge, he is doing well. He notes a long history of exertional shortness of breath associated with associated jaw burning. This is improved since his PCI. He denies chest discomfort, syncope, orthopnea, PND or edema.  Labs (12/13):    K 3.9, creatinine 1.96, ALT 18, LDL 94, Hgb 10.7, TSH 2.5-7  Wt Readings from Last 3 Encounters:  04/13/12 185 lb (83.915 kg)  03/28/12 187 lb 6.3 oz (85 kg)  03/28/12 187 lb 6.3 oz (85 kg)     Past Medical History  Diagnosis Date  . Hypertension   .  Hypercholesterolemia   . Coronary artery disease     a. MI 1992;  b. s/p CABG; c. NSTEMI 12/13:  LHC 03/27/12: pLAD occluded, RCA known to be occluded, SVG-RCA known to be occluded, pSVG-OM 99%, LIMA-LAD not selectively engaged. PCI: Xience Xpedition DES to the proximal SVG-OM;  d.  Echo 03/26/12: EF 60-65%, grade 2 diastolic dysfunction, mild MR, mild LAE, PASP 38, trivial effusion  . Carotid stenosis   . AAA (abdominal aortic aneurysm)   . Exertional angina   . Bronchitis     "3 or 4 times in my life; last time was 1990's" (03/25/2012)  . DM2 (diabetes mellitus, type 2)     "not taking any RX; CBG in am 136-140's" (03/25/2012)  . Ulcerative colitis     s/p total colectomy with ileostomy   . Headache     "used to have them alot before first heart OR; none since" (03/25/2012)  . Arthritis     "nonconfirmed in my right shoulder" (03/25/2012)  . Nephrolithiasis     hx of  . CKD (chronic kidney disease)   . Prostate cancer 1996    "seed implants" (03/25/2012)  . Skin cancer     "several frozen off head" (03/25/2012)    Current Outpatient Prescriptions  Medication Sig Dispense Refill  . amLODipine (NORVASC) 10 MG tablet Take 10 mg by mouth daily.        [name] aspirin 81 MG tablet Take  81 mg by mouth daily.        . calcitRIOL (ROCALTROL) 0.25 MCG capsule Take 0.25 mcg by mouth. Take 1 capsule M-W-F and Saturdays       . carvedilol (COREG) 25 MG tablet Take 1 tablet (25 mg total) by mouth 2 (two) times daily with a meal.  60 tablet  3  . clopidogrel (PLAVIX) 75 MG tablet Take 1 tablet (75 mg total) by mouth daily with breakfast.  30 tablet  3  . folic acid (FOLVITE) 800 MCG tablet Take 800 mcg by mouth daily.        . isosorbide mononitrate (IMDUR) 30 MG 24 hr tablet Take 30 mg by mouth daily.        . Multiple Vitamin (MULTIVITAMIN WITH MINERALS) TABS Take 1 tablet by mouth daily.      . nitroGLYCERIN (NITROSTAT) 0.4 MG SL tablet Place 1 tablet (0.4 mg total) under the tongue every 5  (five) minutes x 3 doses as needed for chest pain.  25 tablet  3  . omeprazole (PRILOSEC) 20 MG capsule Take 20 mg by mouth daily.        . potassium citrate (UROCIT-K) 10 MEQ (1080 MG) SR tablet Take 10 mEq by mouth 2 (two) times daily.        . Red Yeast Rice 600 MG CAPS Take 1 capsule by mouth 2 (two) times daily.      . Tamsulosin HCl (FLOMAX) 0.4 MG CAPS Take 0.4 mg by mouth daily.      . rosuvastatin (CRESTOR) 10 MG tablet Take 10 mg by mouth daily.      . solifenacin (VESICARE) 5 MG tablet Take 5 mg by mouth daily.          Allergies: Allergies  Allergen Reactions  . Atorvastatin     myalgia  . Codeine   . Oxycodone   . Propoxyphene Hcl     Social History:  The patient  reports that he has quit smoking. His smoking use included Cigarettes. He has a 20 pack-year smoking history. He has never used smokeless tobacco. He reports that he drinks alcohol. He reports that he does not use illicit drugs.   ROS:  Please see the history of present illness.      All other systems reviewed and negative.   PHYSICAL EXAM: VS:  BP 138/62  Pulse 70  Ht 5\' 10"  (1.778 m)  Wt 185 lb (83.915 kg)  BMI 26.54 kg/m2 Well nourished, well developed, in no acute distress HEENT: normal Neck: no JVD Cardiac:  normal S1, S2; RRR; no murmur Lungs:  clear to auscultation bilaterally, no wheezing, rhonchi or rales Abd: soft, nontender, no hepatomegaly Ext: Trace bilateral LE edema; right groin without hematoma or bruit  Skin: warm and dry Neuro:  CNs 2-12 intact, no focal abnormalities noted  EKG:  NSR, HR 70, normal axis, nonspecific ST-T wave changes, no change since prior tracing     ASSESSMENT AND PLAN:  1. Coronary Artery Disease:   He has symptoms of exertional angina that are fairly stable. Increase isosorbide to 60 mg daily. Continue aspirin and Plavix.  We discussed the importance of dual antiplatelet therapy. He does not live close to a facility that offers cardiac rehabilitation. He will  continue to exercise on his own.  2. Chronic Kidney Disease:   Obtain a basic metabolic panel today.  3. GERD:   Now that he is on Plavix in the setting of recent DES, his omeprazole  will be discontinued. I have given him a prescription for Protonix 40 mg daily.  4. Hypertension:  Controlled.  5. Hyperlipidemia:  Continue Crestor.  6. Disposition:  He will keep his followup appointment with Dr. Riley Kill in 06/2012 as scheduled.  Signed, Tereso Newcomer, PA-C  11:13 AM 04/13/2012

## 2012-04-13 NOTE — Telephone Encounter (Signed)
Message copied by Tarri Fuller on Mon Apr 13, 2012  5:53 PM ------      Message from: [place], [place] T      Created: Mon Apr 13, 2012  5:06 PM       Renal function fairly stable.      Fax copy to his nephrologist      Tereso Newcomer, PA-C  5:06 PM 04/13/2012

## 2012-04-13 NOTE — Telephone Encounter (Signed)
pt notified about loab results. [facility] but has [facility]t next [facility]. I will mail results to pt to bring to [place]

## 2012-06-16 ENCOUNTER — Ambulatory Visit (INDEPENDENT_AMBULATORY_CARE_PROVIDER_SITE_OTHER): Payer: Medicare Other | Admitting: Cardiology

## 2012-06-16 ENCOUNTER — Encounter (INDEPENDENT_AMBULATORY_CARE_PROVIDER_SITE_OTHER): Payer: Medicare Other

## 2012-06-16 ENCOUNTER — Encounter: Payer: Self-pay | Admitting: Cardiology

## 2012-06-16 VITALS — BP 128/58 | HR 67 | Ht 70.0 in | Wt 185.1 lb

## 2012-06-16 DIAGNOSIS — I714 Abdominal aortic aneurysm, without rupture: Secondary | ICD-10-CM

## 2012-06-16 DIAGNOSIS — I059 Rheumatic mitral valve disease, unspecified: Secondary | ICD-10-CM

## 2012-06-16 DIAGNOSIS — I6529 Occlusion and stenosis of unspecified carotid artery: Secondary | ICD-10-CM

## 2012-06-16 DIAGNOSIS — I25810 Atherosclerosis of coronary artery bypass graft(s) without angina pectoris: Secondary | ICD-10-CM

## 2012-06-20 NOTE — Assessment & Plan Note (Signed)
Has mild MR.  Not clear prolapse.  Continue to watch.

## 2012-06-20 NOTE — Assessment & Plan Note (Signed)
Following at [facility].

## 2012-06-20 NOTE — Assessment & Plan Note (Signed)
Creatinine is up a bit from the last visit.  Therefore, continue to monitor with his primary MD.

## 2012-06-20 NOTE — Progress Notes (Signed)
HPI:  Mr. Heldman returns today in follow up.  He is stable.  He has been better since undergoing PCI.  He has his most recent labs with him, and Cr is 2.6.  Also, we are going, with my retirement, to transition his care over to [facility] since that would be a lot closer to him.  We also discussed his carotid study, which I think he will go ahead and get done today  (his appointment get mixed up).  He continues to remain stable.    Current Outpatient Prescriptions  Medication Sig Dispense Refill  . cholecalciferol (VITAMIN D) 1000 UNITS tablet Take 5,000 Units by mouth daily. 5 days a week      . amLODipine (NORVASC) 10 MG tablet Take 10 mg by mouth daily.        [name] aspirin 81 MG tablet Take 81 mg by mouth daily.        . calcitRIOL (ROCALTROL) 0.25 MCG capsule Take 0.25 mcg by mouth. Take 1 capsule M-W-F and Saturdays       . carvedilol (COREG) 25 MG tablet Take 1 tablet (25 mg total) by mouth 2 (two) times daily with a meal.  180 tablet  3  . clopidogrel (PLAVIX) 75 MG tablet Take 1 tablet (75 mg total) by mouth daily with breakfast.  90 tablet  3  . folic acid (FOLVITE) 800 MCG tablet Take 800 mcg by mouth daily.        . isosorbide mononitrate (IMDUR) 60 MG 24 hr tablet Take 1 tablet (60 mg total) by mouth daily.  90 tablet  3  . Multiple Vitamin (MULTIVITAMIN WITH MINERALS) TABS Take 1 tablet by mouth daily.      . nitroGLYCERIN (NITROSTAT) 0.4 MG SL tablet Place 1 tablet (0.4 mg total) under the tongue every 5 (five) minutes x 3 doses as needed for chest pain.  25 tablet  3  . pantoprazole (PROTONIX) 40 MG tablet Take 1 tablet (40 mg total) by mouth daily.  30 tablet  1  . potassium citrate (UROCIT-K) 10 MEQ (1080 MG) SR tablet Take 10 mEq by mouth 2 (two) times daily.        . Red Yeast Rice 600 MG CAPS Take 1 capsule by mouth 2 (two) times daily.      . rosuvastatin (CRESTOR) 10 MG tablet Take 10 mg by mouth daily.      . solifenacin (VESICARE) 5 MG tablet Take 5 mg by mouth  daily.        . Tamsulosin HCl (FLOMAX) 0.4 MG CAPS Take 0.4 mg by mouth daily.       No current facility-administered medications for this visit.    Allergies  Allergen Reactions  . Atorvastatin     myalgia  . Codeine   . Oxycodone   . Propoxyphene Hcl     Past Medical History  Diagnosis Date  . Hypertension   . Hypercholesterolemia   . Coronary artery disease     a. MI 1992;  b. s/p CABG; c. NSTEMI 12/13:  LHC 03/27/12: pLAD occluded, RCA known to be occluded, SVG-RCA known to be occluded, pSVG-OM 99%, LIMA-LAD not selectively engaged. PCI: Xience Xpedition DES to the proximal SVG-OM;  d.  Echo 03/26/12: EF 60-65%, grade 2 diastolic dysfunction, mild MR, mild LAE, PASP 38, trivial effusion  . Carotid stenosis   . AAA (abdominal aortic aneurysm)   . Exertional angina   . Bronchitis     "3 or 4  times in my life; last time was 1990's" (03/25/2012)  . DM2 (diabetes mellitus, type 2)     "not taking any RX; CBG in am 136-140's" (03/25/2012)  . Ulcerative colitis     s/p total colectomy with ileostomy   . Headache     "used to have them alot before first heart OR; none since" (03/25/2012)  . Arthritis     "nonconfirmed in my right shoulder" (03/25/2012)  . Nephrolithiasis     hx of  . CKD (chronic kidney disease)   . Prostate cancer 1996    "seed implants" (03/25/2012)  . Skin cancer     "several frozen off head" (03/25/2012)    Past Surgical History  Procedure Laterality Date  . Coronary artery bypass graft  1981; 1992    "X 3; X4" (03/25/2012)  . Prior surgery  1981  . Abdominoperineal proctocolectomy  1996    "w/Brooks ileostomy" (03/25/2012)  . Appendectomy      "when I was a little boy" (03/25/2012)  . Radioactive seed implant  1996    "for prostate cancer" (03/25/2012)  . Transurethral resection of bladder tumor  ?2011  . Skin cancer excision  1990's?    "off my back" (03/25/2012)  . Lithotripsy  ~ 2000  . Coronary angioplasty with stent placement  ~ 2010      "1" (03/25/2012)  . Cardiac catheterization      "probably 6; never had a balloon" (03/25/2012)    Family History  Problem Relation Age of Onset  . Heart attack Father 46    died  . Heart disease Father     History   Social History  . Marital Status: Married    Spouse Name: N/A    Number of Children: 3  . Years of Education: N/A   Occupational History  . Not on file.   Social History Main Topics  . Smoking status: Former Smoker -- 1.00 packs/day for 20 years    Types: Cigarettes  . Smokeless tobacco: Never Used     Comment: 03/25/2012 "stopped smoking 1960's"  . Alcohol Use: Yes     Comment: 03/25/2012 "might have 6 beers a year"  . Drug Use: No  . Sexually Active: No   Other Topics Concern  . Not on file   Social History Narrative  . No narrative on file    ROS: Please see the HPI.  All other systems reviewed and negative.  PHYSICAL EXAM:  BP 128/58  Pulse 67  Ht 5\' 10"  (1.778 m)  Wt 185 lb 1.9 oz (83.97 kg)  BMI 26.56 kg/m2  SpO2 97%  General: Well developed, well nourished, in no acute distress. Head:  Normocephalic and atraumatic. Neck: no JVD Lungs: Clear to auscultation and percussion. Heart: Normal S1 and S2.  1/6 apical murmur Abdomen:  Normal bowel sounds; soft; non tender; no organomegaly Pulses: Pulses normal in all 4 extremities. Extremities: No clubbing or cyanosis. No edema. Neurologic: Alert and oriented x 3.  EKG:     ASSESSMENT AND PLAN:  We will arrange follow up with [facility] in [place].   Chart review  45 minutes.

## 2012-06-20 NOTE — Assessment & Plan Note (Signed)
He seems to be stable at the present time.  Cr up compared to post procedure.  Will remain on medical therapy.

## 2012-06-20 NOTE — Assessment & Plan Note (Signed)
Finding stable from last visit.  Continued serial follow up.

## 2012-07-02 ENCOUNTER — Telehealth: Payer: Self-pay | Admitting: Cardiology

## 2012-07-02 NOTE — Telephone Encounter (Signed)
I called Dr. Arsenio Loader office and they are contacting the patient for an appointment.  I will make contact with him next week.

## 2012-07-09 NOTE — Telephone Encounter (Signed)
I spoke with the pt and he was given an appointment with Dr Arsenio Loader office in May.  That office has already sent him a new pt packet for completion. I made the pt aware that we will transfer his records to Dr Arsenio Loader office. Kim in medical records was notified to transfer records.

## 2012-08-18 ENCOUNTER — Telehealth: Payer: Self-pay | Admitting: Cardiology

## 2012-08-18 NOTE — Telephone Encounter (Signed)
Checked to make sure patient had his appointment scheduled with Dr.Bohle in [facility].  He does on July 3.  I encouraged him to have Dr. Mindi Curling call me with questions.

## 2012-12-31 ENCOUNTER — Ambulatory Visit: Payer: Medicare Other | Admitting: Vascular Surgery

## 2012-12-31 ENCOUNTER — Other Ambulatory Visit: Payer: Self-pay | Admitting: *Deleted

## 2012-12-31 ENCOUNTER — Ambulatory Visit (HOSPITAL_COMMUNITY)
Admission: RE | Admit: 2012-12-31 | Discharge: 2012-12-31 | Disposition: A | Discharge status: Home / Self Care | Payer: Medicare Other | Source: Ambulatory Visit | Attending: Neurosurgery | Admitting: Neurosurgery

## 2012-12-31 ENCOUNTER — Ambulatory Visit: Payer: Medicare Other | Admitting: Neurosurgery

## 2012-12-31 ENCOUNTER — Ambulatory Visit (INDEPENDENT_AMBULATORY_CARE_PROVIDER_SITE_OTHER)
Admission: RE | Admit: 2012-12-31 | Discharge: 2012-12-31 | Disposition: A | Discharge status: Home / Self Care | Payer: Medicare Other | Source: Ambulatory Visit

## 2012-12-31 DIAGNOSIS — I739 Peripheral vascular disease, unspecified: Secondary | ICD-10-CM

## 2012-12-31 DIAGNOSIS — I714 Abdominal aortic aneurysm, without rupture: Secondary | ICD-10-CM

## 2013-01-06 ENCOUNTER — Other Ambulatory Visit: Payer: Self-pay | Admitting: *Deleted

## 2013-01-06 DIAGNOSIS — I714 Abdominal aortic aneurysm, without rupture: Secondary | ICD-10-CM

## 2013-01-14 ENCOUNTER — Encounter: Payer: Self-pay | Admitting: Vascular Surgery

## 2013-04-08 HISTORY — PX: OTHER SURGICAL HISTORY: SHX169

## 2014-01-05 ENCOUNTER — Encounter: Payer: Self-pay | Admitting: Family

## 2014-01-06 ENCOUNTER — Ambulatory Visit (HOSPITAL_COMMUNITY)
Admission: RE | Admit: 2014-01-06 | Discharge: 2014-01-06 | Disposition: A | Discharge status: Home / Self Care | Payer: Medicare PPO | Source: Ambulatory Visit | Attending: Vascular Surgery | Admitting: Vascular Surgery

## 2014-01-06 ENCOUNTER — Encounter: Payer: Self-pay | Admitting: Family

## 2014-01-06 ENCOUNTER — Ambulatory Visit (INDEPENDENT_AMBULATORY_CARE_PROVIDER_SITE_OTHER): Payer: Medicare PPO | Admitting: Family

## 2014-01-06 VITALS — BP 117/64 | HR 66 | Resp 16 | Ht 70.0 in | Wt 183.0 lb

## 2014-01-06 DIAGNOSIS — I714 Abdominal aortic aneurysm, without rupture, unspecified: Secondary | ICD-10-CM

## 2014-01-06 DIAGNOSIS — Z48812 Encounter for surgical aftercare following surgery on the circulatory system: Secondary | ICD-10-CM

## 2014-01-06 NOTE — Progress Notes (Signed)
[facility] [place]  Established Abdominal Aortic Aneurysm  History of Present Illness  Mitchell Sloan is a 80 y.o. ([birth year]) male patient of Dr. Oneida Alar followed for known AAA and returns today for follow up. Patient states he has a significant cardiac history, states he has CRF due to the amount of IV contrast he has had. His cardiologist is Dr. Salvadore Oxford, with [facility] in [place]. Patient denies any back or abdominal pain. He is seeing a nephrologist at the [place], pt states he will ask them to fax records. Pt states that his cardiologist is checking his carotid arteries with [place]. He recently had an injection in his left ankle for pain, and now states he has no pain in his ankle The patient reports claudication in right calf, generalized weakness in legs in legs with walking, relieved by rest, this has been going on for about 2 years; but he can walk on a treadmill, holding on to the rails, he has the claudication symptoms but not as severe, denies non healing wounds. The patient denies history of stroke or TIA symptoms. He takes daily 81 mg ASA, Plavix, and Crestor.  Pt Diabetic: Yes, states in good control Pt smoker: former smoker, quit about 1980  Past Medical History  Diagnosis Date  . Hypertension   . Hypercholesterolemia   . Coronary artery disease     a. MI 1992;  b. s/p CABG; c. NSTEMI 12/13:  [place] 03/27/12: pLAD occluded, RCA known to be occluded, SVG-RCA known to be occluded, pSVG-OM 99%, LIMA-LAD not selectively engaged. PCI: Xience Xpedition DES to the proximal SVG-OM;  d.  Echo 03/26/12: EF 36-62%, grade 2 diastolic dysfunction, mild MR, mild LAE, PASP 38, trivial effusion  . Carotid stenosis   . AAA (abdominal aortic aneurysm)   . Exertional angina   . Bronchitis     "3 or 4 times in my life; last time was 1990's" (03/25/2012)  . DM2 (diabetes mellitus, type 2)     "not taking any RX; CBG in am 136-140's" (03/25/2012)  . Ulcerative colitis     s/p  total colectomy with ileostomy   . Headache(784.0)     "used to have them alot before first heart OR; none since" (03/25/2012)  . Arthritis     "nonconfirmed in my right shoulder" (03/25/2012)  . Nephrolithiasis     hx of  . CKD (chronic kidney disease)   . Prostate cancer 1996    "seed implants" (03/25/2012)  . Skin cancer     "several frozen off head" (03/25/2012)   Past Surgical History  Procedure Laterality Date  . Coronary artery bypass graft  1981; 1992    "X 3; X4" (03/25/2012)  . Prior surgery  1981  . Abdominoperineal proctocolectomy  1996    "w/Brooks ileostomy" (03/25/2012)  . Appendectomy      "when I was a little boy" (03/25/2012)  . Radioactive seed implant  1996    "for prostate cancer" (03/25/2012)  . Transurethral resection of bladder tumor  ?2011  . Skin cancer excision  1990's?    "off my back" (03/25/2012)  . Lithotripsy  ~ 2000  . Coronary angioplasty with stent placement  ~ 2010    "1" (03/25/2012)  . Cardiac catheterization      "probably 6; never had a balloon" (03/25/2012)  . Scc Left Jan. 2015    [place] cyst.     Social History History   Social History  . Marital Status: Married  Spouse Name: N/A    Number of Children: 3  . Years of Education: N/A   Occupational History  . Not on file.   Social History Main Topics  . Smoking status: Former Smoker -- 1.00 packs/day for 20 years    Types: Cigarettes  . Smokeless tobacco: Never Used     Comment: 03/25/2012 "stopped smoking 1960's"  . Alcohol Use: Yes     Comment: 03/25/2012 "might have 6 beers a year"  . Drug Use: No  . Sexual Activity: No   Other Topics Concern  . Not on file   Social History Narrative  . No narrative on file   Family History Family History  Problem Relation Age of Onset  . Heart attack Father 29    died  . Heart disease Father   . Diabetes Brother     Amputation-Leg  . Cancer Daughter     Breast    Current Outpatient Prescriptions on File  Prior to Visit  Medication Sig Dispense Refill  . amLODipine (NORVASC) 10 MG tablet Take 10 mg by mouth daily.        [name] aspirin 81 MG tablet Take 81 mg by mouth daily.        . calcitRIOL (ROCALTROL) 0.25 MCG capsule Take 0.25 mcg by mouth. Take 1 capsule M-W-F and Saturdays       . carvedilol (COREG) 25 MG tablet Take 1 tablet (25 mg total) by mouth 2 (two) times daily with a meal.  180 tablet  3  . cholecalciferol (VITAMIN D) 1000 UNITS tablet Take 5,000 Units by mouth daily. 5 days a week      . clopidogrel (PLAVIX) 75 MG tablet Take 1 tablet (75 mg total) by mouth daily with breakfast.  90 tablet  3  . folic acid (FOLVITE) 086 MCG tablet Take 800 mcg by mouth daily.        . isosorbide mononitrate (IMDUR) 60 MG 24 hr tablet Take 1 tablet (60 mg total) by mouth daily.  90 tablet  3  . Multiple Vitamin (MULTIVITAMIN WITH MINERALS) TABS Take 1 tablet by mouth daily.      . nitroGLYCERIN (NITROSTAT) 0.4 MG SL tablet Place 1 tablet (0.4 mg total) under the tongue every 5 (five) minutes x 3 doses as needed for chest pain.  25 tablet  3  . pantoprazole (PROTONIX) 40 MG tablet Take 1 tablet (40 mg total) by mouth daily.  30 tablet  1  . potassium citrate (UROCIT-K) 10 MEQ (1080 MG) SR tablet Take 10 mEq by mouth 2 (two) times daily.        . rosuvastatin (CRESTOR) 10 MG tablet Take 10 mg by mouth daily.      . solifenacin (VESICARE) 5 MG tablet Take 5 mg by mouth daily.        . Tamsulosin HCl (FLOMAX) 0.4 MG CAPS Take 0.4 mg by mouth daily.      . Red Yeast Rice 600 MG CAPS Take 1 capsule by mouth 2 (two) times daily.       No current facility-administered medications on file prior to visit.   Allergies  Allergen Reactions  . Propoxyphene Hcl Nausea And Vomiting    [place]  . Atorvastatin     myalgia  . Codeine   . Oxycodone     ROS: See HPI for pertinent positives and negatives.  Physical Examination  Filed Vitals:   01/06/14 1210  BP: 117/64  Pulse: 66  Resp: 16  Height:  5\' 10"   (1.778 m)  Weight: 183 lb (83.008 kg)  SpO2: 99%   Body mass index is 26.26 kg/(m^2).  General: A&O x 3, WD.  Pulmonary: Sym exp, good air movt, CTAB, no rales, rhonchi, or wheezing.  Cardiac: RRR, Nl S1, S2, positive murmur.   Carotid Bruits Right Left   Positive Positive   Aorta is mildly palpable Radial pulses are 2+ palpable and =                          VASCULAR EXAM:                                                                                                         LE Pulses Right Left       FEMORAL  1+ palpable  1+ palpable        POPLITEAL  1+ palpable   1+ palpable       POSTERIOR TIBIAL  1+ palpable   not palpable        DORSALIS PEDIS      ANTERIOR TIBIAL 2+ palpable  1+ palpable     Gastrointestinal: soft, NTND, -G/R, - HSM, - palpated masses, - CVAT B, colostomy with appliance, RLQ.  Musculoskeletal: M/S 5/5 throughout, Extremities without ischemic changes.  Neurologic: CN 2-12 intact, Pain and light touch intact in extremities are intact, Motor exam as listed above.  Non-Invasive Vascular Imaging  AAA Duplex (01/06/2014) ABDOMINAL AORTA DUPLEX EVALUATION    INDICATION: Follow up aortic aneurysm    PREVIOUS INTERVENTION(S): None    DUPLEX EXAM:     LOCATION DIAMETER AP (cm) DIAMETER TRANSVERSE (cm) VELOCITIES (cm/sec)  Aorta Proximal 2.9 - 65  Aorta Mid 2.4 2.2 61  Aorta Distal 4.3 4.5 47  Right Common Iliac Artery 1.3 - 63  Left Common Iliac Artery 1.7 - 77    Previous max aortic diameter:  4.28 Date: 10/30/2012     ADDITIONAL FINDINGS: Patient not NPO    IMPRESSION: 1. 4.3 x 4.5 cm distal aortic aneurysm. 2. 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     Medical Decision Making  The patient is a 80 y.o. male who presents with asymptomatic AAA with slight increase in size, largest measurement today is 4.5 cm . Pt states he will ask his  cardiologist and nephrologist office to send the most recent CTA and or MRA that may include his AAA, and also the status of his renal function.   Based on this patient's exam and diagnostic studies, the patient will follow up in 6 months  with the following studies: AAA Duplex.  Consideration for repair of AAA would be made when the size is 5.5 cm, growth > 1 cm/yr, and symptomatic status.  I emphasized the importance of maximal medical management including strict control of blood pressure, blood glucose, and lipid levels, antiplatelet agents, obtaining regular exercise, and continued  cessation of smoking.  The patient was given information about AAA including signs, symptoms, treatment, and how to minimize the risk of enlargement and rupture of aneurysms.    The patient was advised to call 911 should the patient experience sudden onset abdominal or back pain.   Thank you for allowing [place] to participate in this patient's care.  Clemon Chambers, RN, MSN, FNP-C [facility] [place] Office: [phone]  Clinic Physician: Oneida Alar  01/06/2014, 12:25 PM

## 2014-01-06 NOTE — Patient Instructions (Signed)
Abdominal Aortic Aneurysm An aneurysm is a weakened or damaged part of an artery wall that bulges from the normal force of blood pumping through the body. An abdominal aortic aneurysm is an aneurysm that occurs in the lower part of the aorta, the main artery of the body.  The major concern with an abdominal aortic aneurysm is that it can enlarge and burst (rupture) or blood can flow between the layers of the wall of the aorta through a tear (aorticdissection). Both of these conditions can cause bleeding inside the body and can be life threatening unless diagnosed and treated promptly. CAUSES  The exact cause of an abdominal aortic aneurysm is unknown. Some contributing factors are:   A hardening of the arteries caused by the buildup of fat and other substances in the lining of a blood vessel (arteriosclerosis).  Inflammation of the walls of an artery (arteritis).   Connective tissue diseases, such as Marfan syndrome.   Abdominal trauma.   An infection, such as syphilis or staphylococcus, in the wall of the aorta (infectious aortitis) caused by bacteria. RISK FACTORS  Risk factors that contribute to an abdominal aortic aneurysm may include:  Age older than 60 years.   High blood pressure (hypertension).  Male gender.  Ethnicity (white race).  Obesity.  Family history of aneurysm (first degree relatives only).  Tobacco use. PREVENTION  The following healthy lifestyle habits may help decrease your risk of abdominal aortic aneurysm:  Quitting smoking. Smoking can raise your blood pressure and cause arteriosclerosis.  Limiting or avoiding alcohol.  Keeping your blood pressure, blood sugar level, and cholesterol levels within normal limits.  Decreasing your salt intake. In somepeople, too much salt can raise blood pressure and increase your risk of abdominal aortic aneurysm.  Eating a diet low in saturated fats and cholesterol.  Increasing your fiber intake by including  whole grains, vegetables, and fruits in your diet. Eating these foods may help lower blood pressure.  Maintaining a healthy weight.  Staying physically active and exercising regularly. SYMPTOMS  The symptoms of abdominal aortic aneurysm may vary depending on the size and rate of growth of the aneurysm.Most grow slowly and do not have any symptoms. When symptoms do occur, they may include:  Pain (abdomen, side, lower back, or groin). The pain may vary in intensity. A sudden onset of severe pain may indicate that the aneurysm has ruptured.  Feeling full after eating only small amounts of food.  Nausea or vomiting or both.  Feeling a pulsating lump in the abdomen.  Feeling faint or passing out. DIAGNOSIS  Since most unruptured abdominal aortic aneurysms have no symptoms, they are often discovered during diagnostic exams for other conditions. An aneurysm may be found during the following procedures:  Ultrasonography (A one-time screening for abdominal aortic aneurysm by ultrasonography is also recommended for all men aged 65-75 years who have ever smoked).  X-ray exams.  A computed tomography (CT).  Magnetic resonance imaging (MRI).  Angiography or arteriography. TREATMENT  Treatment of an abdominal aortic aneurysm depends on the size of your aneurysm, your age, and risk factors for rupture. Medication to control blood pressure and pain may be used to manage aneurysms smaller than 6 cm. Regular monitoring for enlargement may be recommended by your caregiver if:  The aneurysm is 3-4 cm in size (an annual ultrasonography may be recommended).  The aneurysm is 4-4.5 cm in size (an ultrasonography every 6 months may be recommended).  The aneurysm is larger than 4.5 cm in   size (your caregiver may ask that you be examined by a vascular surgeon). If your aneurysm is larger than 6 cm, surgical repair may be recommended. There are two main methods for repair of an aneurysm:   Endovascular  repair (a minimally invasive surgery). This is done most often.  Open repair. This method is used if an endovascular repair is not possible. Document Released: 01/02/2005 Document Revised: 07/20/2012 Document Reviewed: 04/24/2012 Exit[employer] Patient Information 2015 [employer]. This information is not intended to replace advice given to you by your health care provider. Make sure you discuss any questions you have with your health care provider.  

## 2014-01-20 IMAGING — CR DG CHEST 2V
2 series · 2 of 2 positions shown · non-contrast
Comparison: January 18, 2004.

CLINICAL DATA: Chest pain.

CHEST - 2 VIEW

[w chest pa]
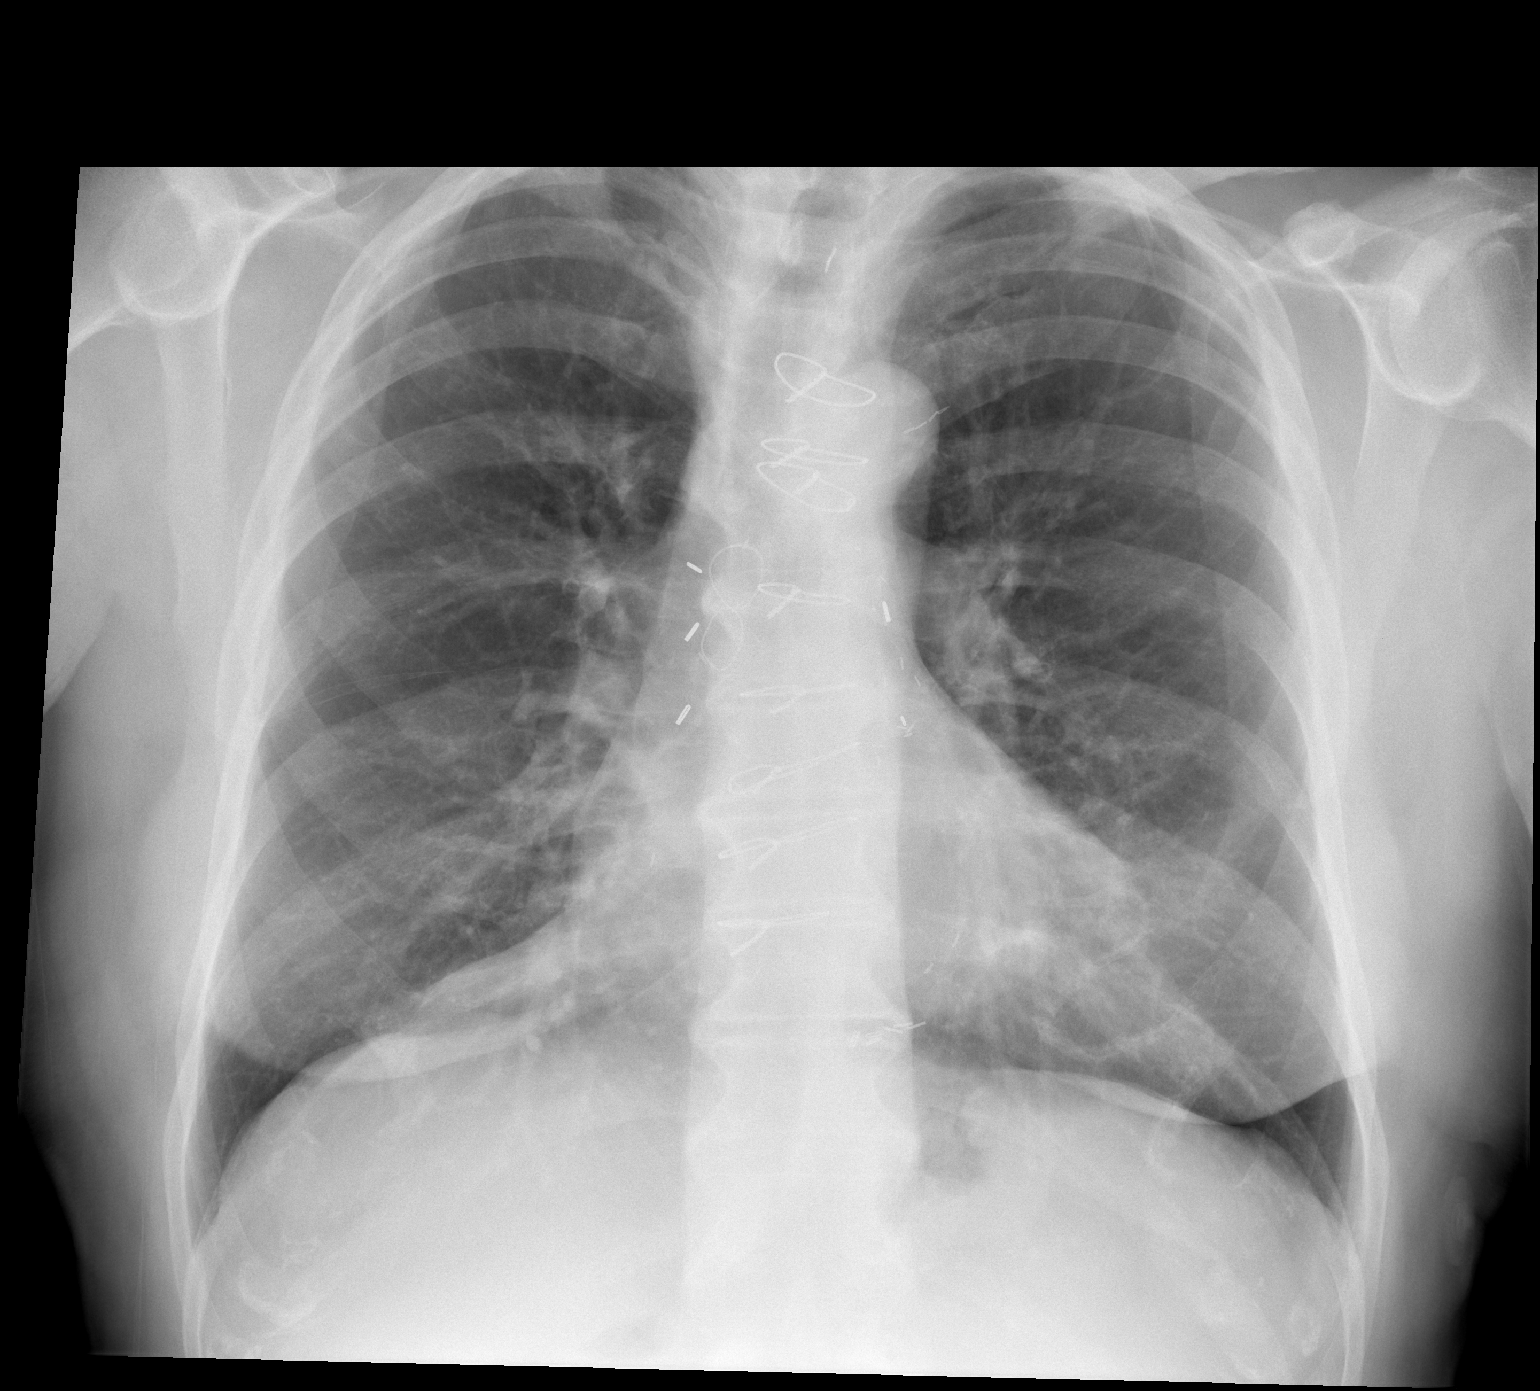

[w chest lat]
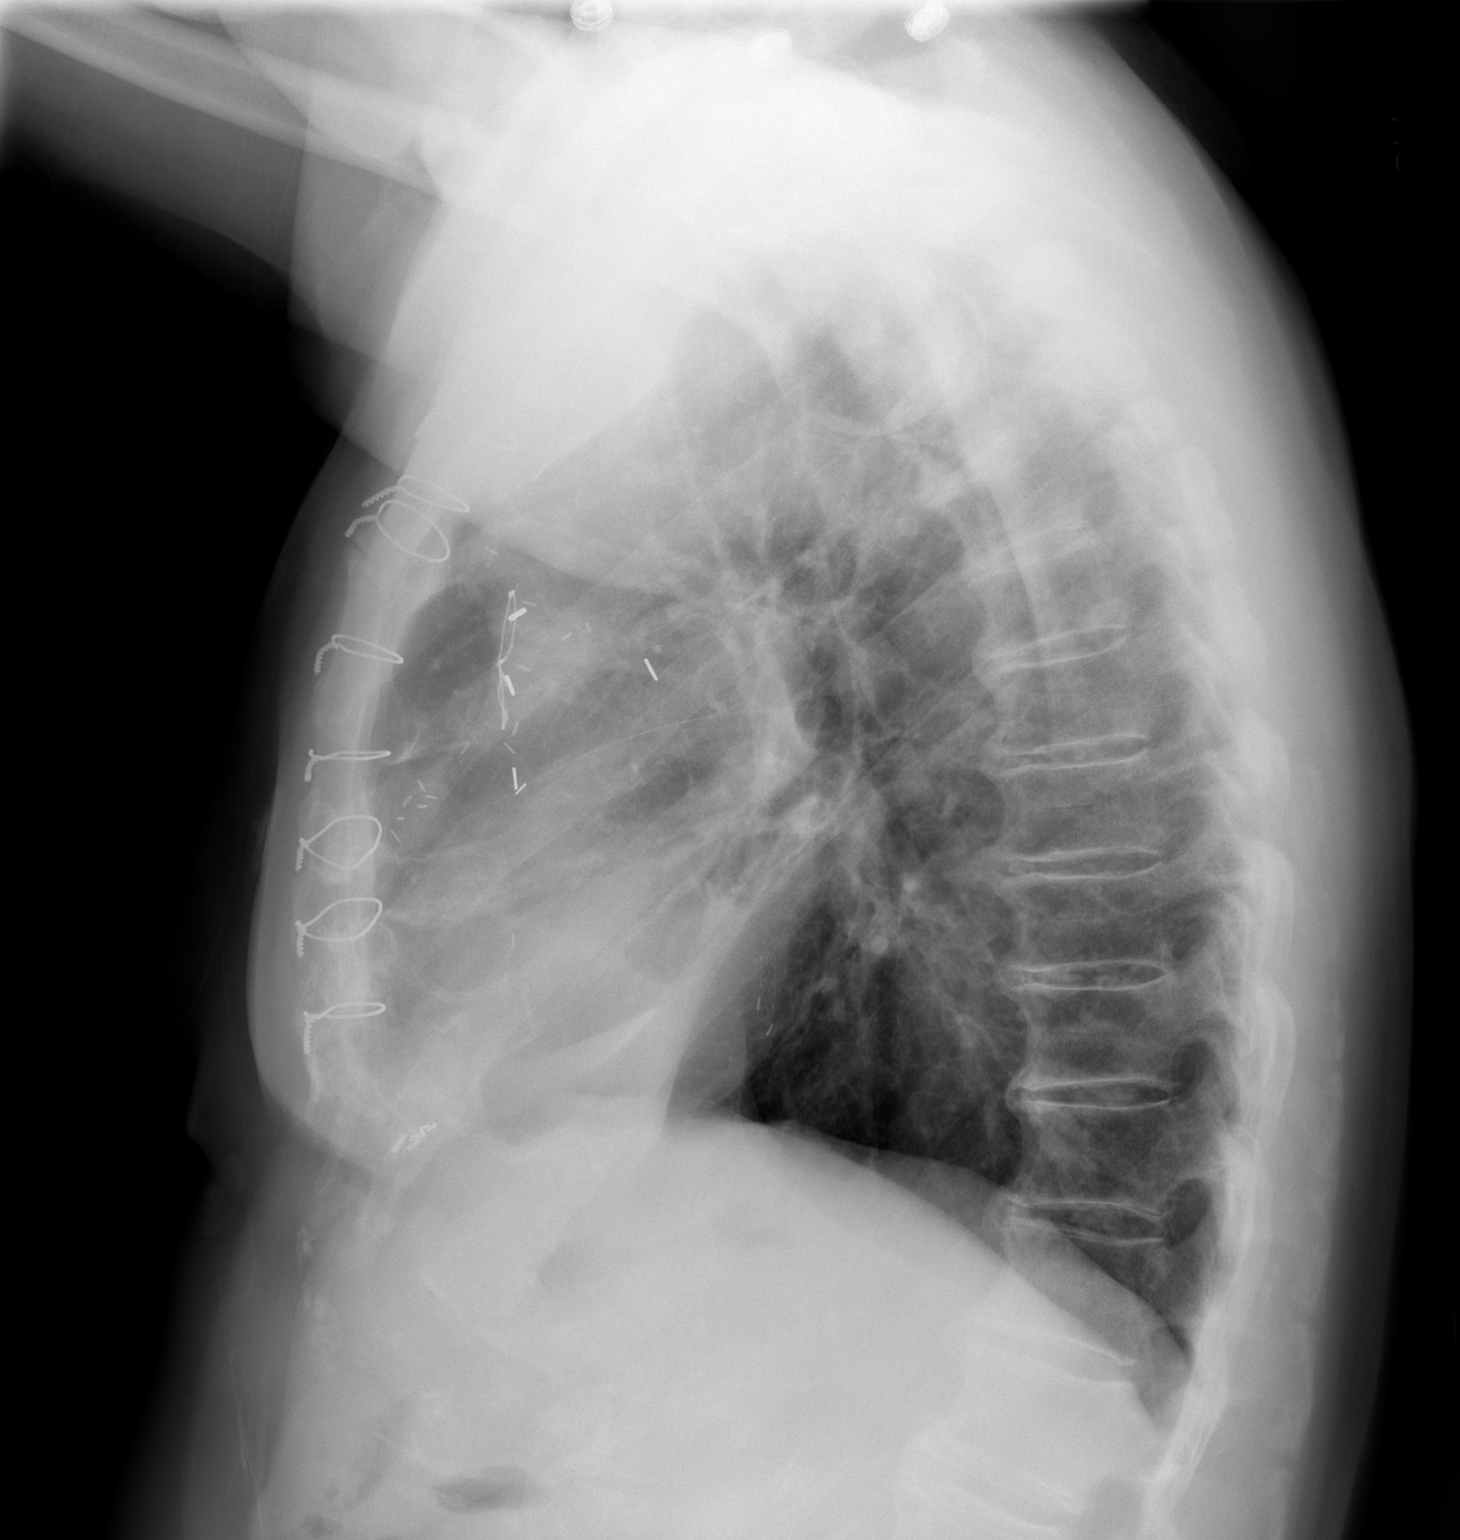

[2 of 2 positions shown; findings below may reference images not displayed]

FINDINGS: Sternotomy wires are noted.  Cardiomediastinal silhouette
appears normal.  Mild scarring is again noted in the lingular
region.  No acute pulmonary disease is noted.
IMPRESSION: No acute cardiopulmonary abnormality seen.

## 2014-03-17 ENCOUNTER — Encounter (HOSPITAL_COMMUNITY): Payer: Self-pay | Admitting: Cardiovascular Disease

## 2014-07-20 ENCOUNTER — Encounter: Payer: Self-pay | Admitting: Family

## 2014-07-21 ENCOUNTER — Other Ambulatory Visit: Payer: Self-pay | Admitting: Family

## 2014-07-21 ENCOUNTER — Ambulatory Visit (HOSPITAL_COMMUNITY)
Admission: RE | Admit: 2014-07-21 | Discharge: 2014-07-21 | Disposition: A | Discharge status: Home / Self Care | Payer: Medicare PPO | Source: Ambulatory Visit | Attending: Family | Admitting: Family

## 2014-07-21 ENCOUNTER — Ambulatory Visit (INDEPENDENT_AMBULATORY_CARE_PROVIDER_SITE_OTHER): Payer: Medicare PPO | Admitting: Family

## 2014-07-21 ENCOUNTER — Encounter: Payer: Self-pay | Admitting: Family

## 2014-07-21 VITALS — BP 140/70 | HR 64 | Resp 16 | Ht 70.0 in | Wt 187.0 lb

## 2014-07-21 DIAGNOSIS — I714 Abdominal aortic aneurysm, without rupture, unspecified: Secondary | ICD-10-CM

## 2014-07-21 DIAGNOSIS — Z87891 Personal history of nicotine dependence: Secondary | ICD-10-CM | POA: Diagnosis not present

## 2014-07-21 DIAGNOSIS — Z48812 Encounter for surgical aftercare following surgery on the circulatory system: Secondary | ICD-10-CM

## 2014-07-21 NOTE — Progress Notes (Signed)
[facility] [place]  Established Abdominal Aortic Aneurysm  History of Present Illness  Mitchell Sloan is a 81 y.o. (03/06/1928) male patient of Dr. Oneida Alar followed for known AAA and returns today for follow up.  Patient states he has a significant cardiac history, states he has CRF due to the amount of IV contrast he has had. His cardiologist is Dr. Salvadore Oxford, with [facility] in [place].  Patient denies any back or abdominal pain.  He is seeing a nephrologist at the [facility], pt states he will ask them to fax records.  Pt states that his cardiologist is checking his carotid arteries with ultrasounds, he denies history of stroke or TIA symptoms.[name]  He recently had an injection in his left ankle for pain, and now states he has no pain in his ankle.  The patient reports claudication in right calf, generalized weakness in legs in legs with walking, relieved by rest, this has been going on since about 2013; but he can walk on a treadmill, holding on to the rails, he has the claudication symptoms but not as severe, denies non healing wounds. Pt reports that his cardiologist also tests the circulation in his legs.  He has an ileostomy after surgery for ulcerative colitis.   He takes daily 81 mg ASA, Plavix, and Crestor.  Pt Diabetic: Yes, states in good control Pt smoker: former smoker, quit about 1980  Past Medical History  Diagnosis Date  . Hypertension   . Hypercholesterolemia   . Coronary artery disease     a. MI 1992;  b. s/p CABG; c. NSTEMI 12/13:  [place] 03/27/12: pLAD occluded, RCA known to be occluded, SVG-RCA known to be occluded, pSVG-OM 99%, LIMA-LAD not selectively engaged. PCI: Xience Xpedition DES to the proximal SVG-OM;  d.  [place] 03/26/12: EF 95-18%, grade 2 diastolic dysfunction, mild MR, mild LAE, PASP 38, trivial effusion  . Carotid stenosis   . AAA (abdominal aortic aneurysm)   . Exertional angina   . Bronchitis     "3 or 4 times in my life; last time  was 1990's" (03/25/2012)  . DM2 (diabetes mellitus, type 2)     "not taking any RX; CBG in am 136-140's" (03/25/2012)  . Ulcerative colitis     s/p total colectomy with ileostomy   . Headache(784.0)     "used to have them alot before first heart OR; none since" (03/25/2012)  . Arthritis     "nonconfirmed in my right shoulder" (03/25/2012)  . Nephrolithiasis     hx of  . CKD (chronic kidney disease)   . Prostate cancer 1996    "seed implants" (03/25/2012)  . Skin cancer     "several frozen off head" (03/25/2012)   Past Surgical History  Procedure Laterality Date  . Coronary artery bypass graft  1981; 1992    "X 3; X4" (03/25/2012)  . Prior surgery  1981  . Abdominoperineal proctocolectomy  1996    "w/Brooks ileostomy" (03/25/2012)  . Appendectomy      "when I was a little boy" (03/25/2012)  . Radioactive seed implant  1996    "for prostate cancer" (03/25/2012)  . Transurethral resection of bladder tumor  ?2011  . Skin cancer excision  1990's?    "off my back" (03/25/2012)  . Lithotripsy  ~ 2000  . Coronary angioplasty with stent placement  ~ 2010    "1" (03/25/2012)  . Cardiac catheterization      "probably 6; never had a balloon" (03/25/2012)  .  Scc Left Jan. 2015    [place] cyst.    . Left heart catheterization with coronary/graft angiogram N/A 03/27/2012    Procedure: LEFT HEART CATHETERIZATION WITH Beatrix Fetters;  Surgeon: Thayer Headings, MD;  Location: [facility] CATH LAB;  Service: Cardiovascular;  Laterality: N/A;  . Percutaneous coronary stent intervention (pci-s)  03/27/2012    Procedure: PERCUTANEOUS CORONARY STENT INTERVENTION (PCI-S);  Surgeon: Burnell Blanks, MD;  Location: [facility] CATH LAB;  Service: Cardiovascular;;   Social History History   Social History  . Marital Status: Married    Spouse Name: N/A  . Number of Children: 3  . Years of Education: N/A   Occupational History  . Not on file.   Social History Main Topics  .  Smoking status: Former Smoker -- 1.00 packs/day for 20 years    Types: Cigarettes  . Smokeless tobacco: Never Used     Comment: 03/25/2012 "stopped smoking 1960's"  . Alcohol Use: Yes     Comment: 03/25/2012 "might have 6 beers a year"  . Drug Use: No  . Sexual Activity: No   Other Topics Concern  . Not on file   Social History Narrative   Family History Family History  Problem Relation Age of Onset  . Heart attack Father 15    died  . Heart disease Father   . Diabetes Brother     Amputation-Leg  . Cancer Daughter     Breast    Current Outpatient Prescriptions on File Prior to Visit  Medication Sig Dispense Refill  . amLODipine (NORVASC) 10 MG tablet Take 10 mg by mouth daily.      [name] aspirin 81 MG tablet Take 81 mg by mouth daily.      . calcitRIOL (ROCALTROL) 0.25 MCG capsule Take 0.25 mcg by mouth. Take 1 capsule M-W-F and Saturdays     . carvedilol (COREG) 25 MG tablet Take 1 tablet (25 mg total) by mouth 2 (two) times daily with a meal. 180 tablet 3  . cholecalciferol (VITAMIN D) 1000 UNITS tablet Take 5,000 Units by mouth daily. 5 days a week    . clopidogrel (PLAVIX) 75 MG tablet Take 1 tablet (75 mg total) by mouth daily with breakfast. 90 tablet 3  . folic acid (FOLVITE) 016 MCG tablet Take 800 mcg by mouth daily.      [name] glimepiride (AMARYL) 1 MG tablet Take 1 mg by mouth daily after breakfast.    . isosorbide mononitrate (IMDUR) 60 MG 24 hr tablet Take 1 tablet (60 mg total) by mouth daily. 90 tablet 3  . Multiple Vitamin (MULTIVITAMIN WITH MINERALS) TABS Take 1 tablet by mouth daily.    . nitroGLYCERIN (NITROSTAT) 0.4 MG SL tablet Place 1 tablet (0.4 mg total) under the tongue every 5 (five) minutes x 3 doses as needed for chest pain. 25 tablet 3  . pantoprazole (PROTONIX) 40 MG tablet Take 1 tablet (40 mg total) by mouth daily. 30 tablet 1  . potassium citrate (UROCIT-K) 10 MEQ (1080 MG) SR tablet Take 10 mEq by mouth 2 (two) times daily.      . Red Yeast Rice 600  MG CAPS Take 1 capsule by mouth 2 (two) times daily.    . rosuvastatin (CRESTOR) 10 MG tablet Take 10 mg by mouth daily.    . solifenacin (VESICARE) 5 MG tablet Take 5 mg by mouth daily.      . Tamsulosin HCl (FLOMAX) 0.4 MG CAPS Take 0.4 mg by mouth daily.  No current facility-administered medications on file prior to visit.   Allergies  Allergen Reactions  . Propoxyphene Hcl Nausea And Vomiting    Darvon  . Atorvastatin     myalgia  . Codeine   . Oxycodone     ROS: See HPI for pertinent positives and negatives.  Physical Examination  Filed Vitals:   07/21/14 1004  BP: 140/70  Pulse: 64  Resp: 16  Height: 5\' 10"  (1.778 m)  Weight: 187 lb (84.823 kg)  SpO2: 98%   Body mass index is 26.83 kg/(m^2).   General: A&O x 3, WD.  Pulmonary: Sym exp, good air movt, CTAB, no rales, rhonchi, or wheezing.  Cardiac: RRR, Nl S1, S2, positive murmur.   Carotid Bruits Right Left   Positive Positive  Aorta is not palpable Radial pulses are 2+ palpable and =   VASCULAR EXAM:     LE Pulses Right Left   FEMORAL 1+ palpable 2+ palpable    POPLITEAL not palpable  not palpable   POSTERIOR TIBIAL not palpable  not palpable    DORSALIS PEDIS  ANTERIOR TIBIAL 2+ palpable  2+ palpable     Gastrointestinal: soft, NTND, -G/R, - HSM, - palpated masses, - CVAT B, colostomy with appliance RLQ.  Musculoskeletal: M/S 5/5 throughout, Extremities without ischemic changes.  Neurologic: CN 2-12 intact, Pain and light touch intact in extremities are intact, Motor exam as listed above.         Non-Invasive Vascular Imaging  AAA Duplex (07/21/2014) ABDOMINAL AORTA DUPLEX EVALUATION    INDICATION: Abdominal Aortic Aneurysm    PREVIOUS INTERVENTION(S): NA    DUPLEX EXAM:      LOCATION DIAMETER AP (cm) DIAMETER TRANSVERSE (cm) VELOCITIES (cm/sec)  Aorta Proximal 2.61 2.45 56  Aorta Mid 2.49 2.20 78  Aorta Distal 4.14 4.78 29  Right Common Iliac Artery 1.40 1.31 99  Left Common Iliac Artery 1.56 1.47 61    Previous max aortic diameter:  4.3cm x 4.5cm  Date: 01/06/2014  ADDITIONAL FINDINGS:     IMPRESSION: Abdominal aortic aneurysm present measuring approximately 4.14cm AP x 4.78cm TRV, no intramural thrombus present.  Patent proximal common iliac arteries within normal diameters.    Compared to the previous exam:  Slight increase in diameter since previous study on 01/06/2014.     Medical Decision Making  The patient is a 81 y.o. male who presents with asymptomatic AAA with a slight increase in diameter since previous study on 01/06/2014, from 4.5 to 4.78 cm at the largest diameter. Patient states he has a significant cardiac history, states he has CRF due to the amount of IV contrast he has had.  Face to face time with patient was 25 minutes. Over 50% of this time was spent on counseling and coordination of care.    Based on this patient's exam and diagnostic studies, the patient will follow up in 6 months  with the following studies: AAA Duplex.  Consideration for repair of AAA would be made when the size is 5.5 cm, growth > 1 cm/yr, and symptomatic status.  I emphasized the importance of maximal medical management including strict control of blood pressure, blood glucose, and lipid levels, antiplatelet agents, obtaining regular exercise, and continued cessation of smoking.   The patient was given information about AAA including signs, symptoms, treatment, and how to minimize the risk of enlargement and rupture of aneurysms.    The patient was advised to call 911 should the patient experience sudden onset abdominal or back pain.  Thank you for allowing [place] to participate in this patient's care.  Clemon Chambers, RN, MSN, FNP-C [facility] [place] Office: [facility] Physician: Oneida Alar  07/21/2014, 10:06 AM

## 2014-07-21 NOTE — Patient Instructions (Signed)
Abdominal Aortic Aneurysm An aneurysm is a weakened or damaged part of an artery wall that bulges from the normal force of blood pumping through the body. An abdominal aortic aneurysm is an aneurysm that occurs in the lower part of the aorta, the main artery of the body.  The major concern with an abdominal aortic aneurysm is that it can enlarge and burst (rupture) or blood can flow between the layers of the wall of the aorta through a tear (aorticdissection). Both of these conditions can cause bleeding inside the body and can be life threatening unless diagnosed and treated promptly. CAUSES  The exact cause of an abdominal aortic aneurysm is unknown. Some contributing factors are:   A hardening of the arteries caused by the buildup of fat and other substances in the lining of a blood vessel (arteriosclerosis).  Inflammation of the walls of an artery (arteritis).   Connective tissue diseases, such as Marfan syndrome.   Abdominal trauma.   An infection, such as syphilis or staphylococcus, in the wall of the aorta (infectious aortitis) caused by bacteria. RISK FACTORS  Risk factors that contribute to an abdominal aortic aneurysm may include:  Age older than 60 years.   High blood pressure (hypertension).  Male gender.  Ethnicity (white race).  Obesity.  Family history of aneurysm (first degree relatives only).  Tobacco use. PREVENTION  The following healthy lifestyle habits may help decrease your risk of abdominal aortic aneurysm:  Quitting smoking. Smoking can raise your blood pressure and cause arteriosclerosis.  Limiting or avoiding alcohol.  Keeping your blood pressure, blood sugar level, and cholesterol levels within normal limits.  Decreasing your salt intake. In somepeople, too much salt can raise blood pressure and increase your risk of abdominal aortic aneurysm.  Eating a diet low in saturated fats and cholesterol.  Increasing your fiber intake by including  whole grains, vegetables, and fruits in your diet. Eating these foods may help lower blood pressure.  Maintaining a healthy weight.  Staying physically active and exercising regularly. SYMPTOMS  The symptoms of abdominal aortic aneurysm may vary depending on the size and rate of growth of the aneurysm.Most grow slowly and do not have any symptoms. When symptoms do occur, they may include:  Pain (abdomen, side, lower back, or groin). The pain may vary in intensity. A sudden onset of severe pain may indicate that the aneurysm has ruptured.  Feeling full after eating only small amounts of food.  Nausea or vomiting or both.  Feeling a pulsating lump in the abdomen.  Feeling faint or passing out. DIAGNOSIS  Since most unruptured abdominal aortic aneurysms have no symptoms, they are often discovered during diagnostic exams for other conditions. An aneurysm may be found during the following procedures:  Ultrasonography (A one-time screening for abdominal aortic aneurysm by ultrasonography is also recommended for all men aged 65-75 years who have ever smoked).  X-ray exams.  A computed tomography (CT).  Magnetic resonance imaging (MRI).  Angiography or arteriography. TREATMENT  Treatment of an abdominal aortic aneurysm depends on the size of your aneurysm, your age, and risk factors for rupture. Medication to control blood pressure and pain may be used to manage aneurysms smaller than 6 cm. Regular monitoring for enlargement may be recommended by your caregiver if:  The aneurysm is 3-4 cm in size (an annual ultrasonography may be recommended).  The aneurysm is 4-4.5 cm in size (an ultrasonography every 6 months may be recommended).  The aneurysm is larger than 4.5 cm in   size (your caregiver may ask that you be examined by a vascular surgeon). If your aneurysm is larger than 6 cm, surgical repair may be recommended. There are two main methods for repair of an aneurysm:   Endovascular  repair (a minimally invasive surgery). This is done most often.  Open repair. This method is used if an endovascular repair is not possible. Document Released: 01/02/2005 Document Revised: 07/20/2012 Document Reviewed: 04/24/2012 Exit[employer] Patient Information 2015 [employer]. This information is not intended to replace advice given to you by your health care provider. Make sure you discuss any questions you have with your health care provider.  

## 2015-01-24 ENCOUNTER — Encounter: Payer: Self-pay | Admitting: Family

## 2015-01-26 ENCOUNTER — Ambulatory Visit (INDEPENDENT_AMBULATORY_CARE_PROVIDER_SITE_OTHER): Payer: Medicare PPO | Admitting: Family

## 2015-01-26 ENCOUNTER — Other Ambulatory Visit: Payer: Self-pay | Admitting: Family

## 2015-01-26 ENCOUNTER — Encounter: Payer: Self-pay | Admitting: Family

## 2015-01-26 ENCOUNTER — Ambulatory Visit (HOSPITAL_COMMUNITY)
Admission: RE | Admit: 2015-01-26 | Discharge: 2015-01-26 | Disposition: A | Discharge status: Home / Self Care | Payer: Medicare PPO | Source: Ambulatory Visit | Attending: Family | Admitting: Family

## 2015-01-26 VITALS — BP 123/68 | HR 67 | Ht 70.0 in | Wt 179.5 lb

## 2015-01-26 DIAGNOSIS — N189 Chronic kidney disease, unspecified: Secondary | ICD-10-CM | POA: Diagnosis not present

## 2015-01-26 DIAGNOSIS — I129 Hypertensive chronic kidney disease with stage 1 through stage 4 chronic kidney disease, or unspecified chronic kidney disease: Secondary | ICD-10-CM | POA: Diagnosis not present

## 2015-01-26 DIAGNOSIS — I714 Abdominal aortic aneurysm, without rupture, unspecified: Secondary | ICD-10-CM

## 2015-01-26 DIAGNOSIS — Z87891 Personal history of nicotine dependence: Secondary | ICD-10-CM | POA: Insufficient documentation

## 2015-01-26 DIAGNOSIS — E119 Type 2 diabetes mellitus without complications: Secondary | ICD-10-CM | POA: Diagnosis not present

## 2015-01-26 DIAGNOSIS — E7800 Pure hypercholesterolemia, unspecified: Secondary | ICD-10-CM | POA: Insufficient documentation

## 2015-01-26 DIAGNOSIS — I2510 Atherosclerotic heart disease of native coronary artery without angina pectoris: Secondary | ICD-10-CM | POA: Diagnosis not present

## 2015-01-26 NOTE — Patient Instructions (Signed)
Abdominal Aortic Aneurysm An aneurysm is a weakened or damaged part of an artery wall that bulges from the normal force of blood pumping through the body. An abdominal aortic aneurysm is an aneurysm that occurs in the lower part of the aorta, the main artery of the body.  The major concern with an abdominal aortic aneurysm is that it can enlarge and burst (rupture) or blood can flow between the layers of the wall of the aorta through a tear (aorticdissection). Both of these conditions can cause bleeding inside the body and can be life threatening unless diagnosed and treated promptly. CAUSES  The exact cause of an abdominal aortic aneurysm is unknown. Some contributing factors are:   A hardening of the arteries caused by the buildup of fat and other substances in the lining of a blood vessel (arteriosclerosis).  Inflammation of the walls of an artery (arteritis).   Connective tissue diseases, such as Marfan syndrome.   Abdominal trauma.   An infection, such as syphilis or staphylococcus, in the wall of the aorta (infectious aortitis) caused by bacteria. RISK FACTORS  Risk factors that contribute to an abdominal aortic aneurysm may include:  Age older than 60 years.   High blood pressure (hypertension).  Male gender.  Ethnicity (white race).  Obesity.  Family history of aneurysm (first degree relatives only).  Tobacco use. PREVENTION  The following healthy lifestyle habits may help decrease your risk of abdominal aortic aneurysm:  Quitting smoking. Smoking can raise your blood pressure and cause arteriosclerosis.  Limiting or avoiding alcohol.  Keeping your blood pressure, blood sugar level, and cholesterol levels within normal limits.  Decreasing your salt intake. In somepeople, too much salt can raise blood pressure and increase your risk of abdominal aortic aneurysm.  Eating a diet low in saturated fats and cholesterol.  Increasing your fiber intake by including  whole grains, vegetables, and fruits in your diet. Eating these foods may help lower blood pressure.  Maintaining a healthy weight.  Staying physically active and exercising regularly. SYMPTOMS  The symptoms of abdominal aortic aneurysm may vary depending on the size and rate of growth of the aneurysm.Most grow slowly and do not have any symptoms. When symptoms do occur, they may include:  Pain (abdomen, side, lower back, or groin). The pain may vary in intensity. A sudden onset of severe pain may indicate that the aneurysm has ruptured.  Feeling full after eating only small amounts of food.  Nausea or vomiting or both.  Feeling a pulsating lump in the abdomen.  Feeling faint or passing out. DIAGNOSIS  Since most unruptured abdominal aortic aneurysms have no symptoms, they are often discovered during diagnostic exams for other conditions. An aneurysm may be found during the following procedures:  Ultrasonography (A one-time screening for abdominal aortic aneurysm by ultrasonography is also recommended for all men aged 65-75 years who have ever smoked).  X-ray exams.  A computed tomography (CT).  Magnetic resonance imaging (MRI).  Angiography or arteriography. TREATMENT  Treatment of an abdominal aortic aneurysm depends on the size of your aneurysm, your age, and risk factors for rupture. Medication to control blood pressure and pain may be used to manage aneurysms smaller than 6 cm. Regular monitoring for enlargement may be recommended by your caregiver if:  The aneurysm is 3-4 cm in size (an annual ultrasonography may be recommended).  The aneurysm is 4-4.5 cm in size (an ultrasonography every 6 months may be recommended).  The aneurysm is larger than 4.5 cm in   size (your caregiver may ask that you be examined by a vascular surgeon). If your aneurysm is larger than 6 cm, surgical repair may be recommended. There are two main methods for repair of an aneurysm:   Endovascular  repair (a minimally invasive surgery). This is done most often.  Open repair. This method is used if an endovascular repair is not possible.   This information is not intended to replace advice given to you by your health care provider. Make sure you discuss any questions you have with your health care provider.   Document Released: 01/02/2005 Document Revised: 07/20/2012 Document Reviewed: 04/24/2012 Elsevier Interactive Patient Education 2016 [employer]  

## 2015-01-26 NOTE — Progress Notes (Signed)
[facility] [place]  Established Abdominal Aortic Aneurysm  History of Present Illness  Mitchell Sloan is a 81 y.o. (Apr 24, 1927) male patient of Dr. Oneida Alar followed for known AAA and returns today for follow up.  Patient states he has a significant cardiac history, states he has CRF due to the amount of IV contrast he has had. His cardiologist is Dr. Salvadore Oxford, with [facility] in [place].  Patient denies any back or abdominal pain.  He is seeing a nephrologist at the [place].  Pt states that his cardiologist is checking his carotid arteries with ultrasounds and also tests the circulation in his legs, he denies history of stroke or TIA symptoms..  The patient reports improving claudication in right calf, generalized weakness in legs in legs with walking, relieved by rest, this has been going on since about 2013; but he can walk on a treadmill, holding on to the rails, he has the claudication symptoms but not as severe, denies non healing wounds.  He feels that he has no stamina and is not walking much. He exercises on machines at a health club 3x/week.  He has an ileostomy after surgery for ulcerative colitis.   He takes daily 81 mg ASA, Plavix, and atorvastatin.  Pt Diabetic: Yes, states in good control Pt smoker: former smoker, quit about 1980    Past Medical History  Diagnosis Date  . Hypertension   . Hypercholesterolemia   . Coronary artery disease     a. MI 1992;  b. s/p CABG; c. NSTEMI 12/13:  [place] 03/27/12: pLAD occluded, RCA known to be occluded, SVG-RCA known to be occluded, pSVG-OM 99%, LIMA-LAD not selectively engaged. PCI: Xience Xpedition DES to the proximal SVG-OM;  d.  [place] 03/26/12: EF 14-48%, grade 2 diastolic dysfunction, mild MR, mild LAE, PASP 38, trivial effusion  . Carotid stenosis   . AAA (abdominal aortic aneurysm) ([place])   . Exertional angina (HCC)   . Bronchitis     "3 or 4 times in my life; last time was 1990's" (03/25/2012)  . DM2  (diabetes mellitus, type 2) ([place])     "not taking any RX; CBG in am 136-140's" (03/25/2012)  . Ulcerative colitis ([place])     s/p total colectomy with ileostomy   . Headache(784.0)     "used to have them alot before first heart OR; none since" (03/25/2012)  . Arthritis     "nonconfirmed in my right shoulder" (03/25/2012)  . Nephrolithiasis     hx of  . CKD (chronic kidney disease)   . Prostate cancer ([place]) 1996    "seed implants" (03/25/2012)  . Skin cancer     "several frozen off head" (03/25/2012)   Past Surgical History  Procedure Laterality Date  . Coronary artery bypass graft  1981; 1992    "X 3; X4" (03/25/2012)  . Prior surgery  1981  . Abdominoperineal proctocolectomy  1996    "w/Brooks ileostomy" (03/25/2012)  . Appendectomy      "when I was a little boy" (03/25/2012)  . Radioactive seed implant  1996    "for prostate cancer" (03/25/2012)  . Transurethral resection of bladder tumor  ?2011  . Skin cancer excision  1990's?    "off my back" (03/25/2012)  . Lithotripsy  ~ 2000  . Coronary angioplasty with stent placement  ~ 2010    "1" (03/25/2012)  . Cardiac catheterization      "probably 6; never had a balloon" (03/25/2012)  . Scc Left Jan. 2015  [place] cyst.    . Left heart catheterization with coronary/graft angiogram N/A 03/27/2012    Procedure: LEFT HEART CATHETERIZATION WITH Beatrix Fetters;  Surgeon: Thayer Headings, MD;  Location: [facility] CATH LAB;  Service: Cardiovascular;  Laterality: N/A;  . Percutaneous coronary stent intervention (pci-s)  03/27/2012    Procedure: PERCUTANEOUS CORONARY STENT INTERVENTION (PCI-S);  Surgeon: Burnell Blanks, MD;  Location: [facility] CATH LAB;  Service: Cardiovascular;;   Social History Social History   Social History  . Marital Status: Married    Spouse Name: N/A  . Number of Children: 3  . Years of Education: N/A   Occupational History  . Not on file.   Social History Main Topics  . Smoking  status: Former Smoker -- 1.00 packs/day for 20 years    Types: Cigarettes  . Smokeless tobacco: Never Used     Comment: 03/25/2012 "stopped smoking 1960's"  . Alcohol Use: Yes     Comment: 03/25/2012 "might have 6 beers a year"  . Drug Use: No  . Sexual Activity: No   Other Topics Concern  . Not on file   Social History Narrative   Family History Family History  Problem Relation Age of Onset  . Heart attack Father 65    died  . Heart disease Father   . Diabetes Brother     Amputation-Leg  . Heart disease Brother     before age 66  . Cancer Daughter     Breast  . Diabetes Mother   . Diabetes Sister     Current Outpatient Prescriptions on File Prior to Visit  Medication Sig Dispense Refill  . amLODipine (NORVASC) 10 MG tablet Take 10 mg by mouth daily.      [name] aspirin 81 MG tablet Take 81 mg by mouth daily.      . calcitRIOL (ROCALTROL) 0.25 MCG capsule Take 0.25 mcg by mouth. Take 1 capsule M-W-F and Saturdays     . carvedilol (COREG) 25 MG tablet Take 1 tablet (25 mg total) by mouth 2 (two) times daily with a meal. 180 tablet 3  . cholecalciferol (VITAMIN D) 1000 UNITS tablet Take 5,000 Units by mouth daily. 5 days a week    . Cholecalciferol (VITAMIN D3) 5000 UNITS CAPS Take by mouth. Take 1 Tablet Monday -Friday    . clopidogrel (PLAVIX) 75 MG tablet Take 1 tablet (75 mg total) by mouth daily with breakfast. 90 tablet 3  . folic acid (FOLVITE) 469 MCG tablet Take 800 mcg by mouth daily.      [name] glimepiride (AMARYL) 1 MG tablet Take 1 mg by mouth daily after breakfast.    . isosorbide mononitrate (IMDUR) 60 MG 24 hr tablet Take 1 tablet (60 mg total) by mouth daily. 90 tablet 3  . Multiple Vitamin (MULTIVITAMIN WITH MINERALS) TABS Take 1 tablet by mouth daily.    . nitroGLYCERIN (NITROSTAT) 0.4 MG SL tablet Place 1 tablet (0.4 mg total) under the tongue every 5 (five) minutes x 3 doses as needed for chest pain. 25 tablet 3  . pantoprazole (PROTONIX) 40 MG tablet Take 1  tablet (40 mg total) by mouth daily. 30 tablet 1  . potassium citrate (UROCIT-K) 10 MEQ (1080 MG) SR tablet Take 10 mEq by mouth 2 (two) times daily.      . Red Yeast Rice 600 MG CAPS Take 1 capsule by mouth 2 (two) times daily.    . rosuvastatin (CRESTOR) 10 MG tablet Take 10 mg by mouth  daily.    . solifenacin (VESICARE) 5 MG tablet Take 5 mg by mouth daily.      . Tamsulosin HCl (FLOMAX) 0.4 MG CAPS Take 0.4 mg by mouth daily.     No current facility-administered medications on file prior to visit.   Allergies  Allergen Reactions  . Propoxyphene Hcl Nausea And Vomiting    Darvon  . Atorvastatin     myalgia  . Codeine   . Oxycodone     ROS: See HPI for pertinent positives and negatives.  Physical Examination  Filed Vitals:   01/26/15 1045  BP: 123/68  Pulse: 67  Height: 5\' 10"  (1.778 m)  Weight: 179 lb 8 oz (81.421 kg)  SpO2: 96%   Body mass index is 25.76 kg/(m^2).  General: A&O x 3, WD.  Pulmonary: Sym exp, good air movt, CTAB, no rales, rhonchi, or wheezing.  Cardiac: RRR, Nl S1, S2, positive murmur.   Carotid Bruits Right Left   Positive Positive  Aorta is not palpable Radial pulses are 2+ palpable and =   VASCULAR EXAM:     LE Pulses Right Left   FEMORAL 1+ palpable 2+ palpable    POPLITEAL not palpable  not palpable   POSTERIOR TIBIAL not palpable  not palpable    DORSALIS PEDIS  ANTERIOR TIBIAL 2+ palpable  2+ palpable     Gastrointestinal: soft, NTND, -G/R, - HSM, - palpated masses, - CVAT B, ililostomy with appliance RLQ.  Musculoskeletal: M/S 5/5 throughout, Extremities without ischemic changes.  Neurologic: CN 2-12 intact, Pain and light touch intact in extremities are intact, Motor exam as listed above.                Non-Invasive Vascular Imaging  AAA Duplex (01/26/2015)  Previous size: 4.78 cm (Date: 07/21/2014)  Current size:  4.17 cm (Date: 01/26/2015)  Medical Decision Making  The patient is a 81 y.o. male who presents with asymptomatic AAA with stable size. Patient states he has a significant cardiac history, states he has CRF due to the amount of IV contrast he has had.   Based on this patient's exam and diagnostic studies, the patient will follow up in 6 months with the following studies: AAA duplex.  Consideration for repair of AAA would be made when the size is 5.5 cm, growth > 1 cm/yr, and symptomatic status.  I emphasized the importance of maximal medical management including strict control of blood pressure, blood glucose, and lipid levels, antiplatelet agents, obtaining regular exercise, and continued cessation of smoking.   The patient was given information about AAA including signs, symptoms, treatment, and how to minimize the risk of enlargement and rupture of aneurysms.    The patient was advised to call 911 should the patient experience sudden onset abdominal or back pain.   Thank you for allowing [place] to participate in this patient's care.  Clemon Chambers, RN, MSN, FNP-C [facility] [place] Office: [facility] Physician: Oneida Alar  01/26/2015, 10:54 AM

## 2015-01-31 NOTE — Addendum Note (Signed)
Addended by: Dorthula Rue L on: 01/31/2015 02:41 PM   Modules accepted: Orders

## 2015-08-03 ENCOUNTER — Ambulatory Visit: Payer: Medicare PPO | Admitting: Family

## 2015-08-03 ENCOUNTER — Other Ambulatory Visit (HOSPITAL_COMMUNITY): Payer: Medicare PPO

## 2020-06-06 DEATH — deceased
